# Patient Record
Sex: Male | Born: 1955 | Race: Black or African American | Hispanic: No | Marital: Single | Smoking: Never smoker
Health system: Southern US, Community
[De-identification: ages and names within clinical notes are randomized; demographics above are authoritative.]

## PROBLEM LIST (undated history)

## (undated) DIAGNOSIS — M109 Gout, unspecified: Secondary | ICD-10-CM

## (undated) DIAGNOSIS — I10 Essential (primary) hypertension: Secondary | ICD-10-CM

## (undated) DIAGNOSIS — I8290 Acute embolism and thrombosis of unspecified vein: Secondary | ICD-10-CM

## (undated) HISTORY — DX: Acute embolism and thrombosis of unspecified vein: I82.90

## (undated) HISTORY — DX: Gout, unspecified: M10.9

## (undated) HISTORY — DX: Essential (primary) hypertension: I10

## (undated) HISTORY — PX: FRACTURE SURGERY: SHX138

---

## 2010-04-19 ENCOUNTER — Inpatient Hospital Stay: Payer: Self-pay | Admitting: Internal Medicine

## 2010-04-25 ENCOUNTER — Ambulatory Visit: Payer: Self-pay | Admitting: Internal Medicine

## 2010-05-02 ENCOUNTER — Ambulatory Visit: Payer: Self-pay | Admitting: Internal Medicine

## 2010-05-25 ENCOUNTER — Ambulatory Visit: Payer: Self-pay

## 2010-05-31 ENCOUNTER — Ambulatory Visit: Payer: Self-pay | Admitting: Internal Medicine

## 2011-12-05 ENCOUNTER — Emergency Department: Payer: Self-pay | Admitting: *Deleted

## 2011-12-05 LAB — COMPREHENSIVE METABOLIC PANEL
Alkaline Phosphatase: 87 U/L (ref 50–136)
Anion Gap: 5 — ABNORMAL LOW (ref 7–16)
Calcium, Total: 9.3 mg/dL (ref 8.5–10.1)
Chloride: 106 mmol/L (ref 98–107)
Co2: 28 mmol/L (ref 21–32)
Creatinine: 1.58 mg/dL — ABNORMAL HIGH (ref 0.60–1.30)
Sodium: 139 mmol/L (ref 136–145)

## 2011-12-05 LAB — CBC WITH DIFFERENTIAL/PLATELET
Basophil #: 0 10*3/uL (ref 0.0–0.1)
Basophil %: 0.7 %
Eosinophil #: 0.1 10*3/uL (ref 0.0–0.7)
Eosinophil %: 2.6 %
HCT: 42.2 % (ref 40.0–52.0)
HGB: 13.5 g/dL (ref 13.0–18.0)
Lymphocyte #: 1.7 10*3/uL (ref 1.0–3.6)
MCH: 26.2 pg (ref 26.0–34.0)
MCV: 82 fL (ref 80–100)
Monocyte #: 0.5 x10 3/mm (ref 0.2–1.0)
Neutrophil #: 1.6 10*3/uL (ref 1.4–6.5)
Platelet: 191 10*3/uL (ref 150–440)
RBC: 5.16 10*6/uL (ref 4.40–5.90)

## 2011-12-05 LAB — PROTIME-INR
INR: 3.4
Prothrombin Time: 34.2 secs — ABNORMAL HIGH (ref 11.5–14.7)

## 2011-12-05 LAB — URINALYSIS, COMPLETE
RBC,UR: 1327 /HPF (ref 0–5)
Squamous Epithelial: NONE SEEN

## 2011-12-11 ENCOUNTER — Emergency Department: Payer: Self-pay | Admitting: Emergency Medicine

## 2011-12-11 LAB — URINALYSIS, COMPLETE
Leukocyte Esterase: NEGATIVE
Nitrite: NEGATIVE
Protein: 100
RBC,UR: 285 /HPF (ref 0–5)
WBC UR: 7 /HPF (ref 0–5)

## 2011-12-11 LAB — PROTIME-INR
INR: 1.9
Prothrombin Time: 22.4 secs — ABNORMAL HIGH (ref 11.5–14.7)

## 2011-12-11 LAB — BASIC METABOLIC PANEL
Anion Gap: 9 (ref 7–16)
Calcium, Total: 8.9 mg/dL (ref 8.5–10.1)
EGFR (Non-African Amer.): 59 — ABNORMAL LOW
Osmolality: 286 (ref 275–301)
Potassium: 4.1 mmol/L (ref 3.5–5.1)

## 2011-12-11 LAB — CBC
HCT: 33.8 % — ABNORMAL LOW (ref 40.0–52.0)
MCH: 27.1 pg (ref 26.0–34.0)
MCHC: 33.4 g/dL (ref 32.0–36.0)
MCV: 81 fL (ref 80–100)
RDW: 16.5 % — ABNORMAL HIGH (ref 11.5–14.5)

## 2011-12-11 LAB — APTT: Activated PTT: 35.9 secs (ref 23.6–35.9)

## 2011-12-14 ENCOUNTER — Emergency Department: Payer: Self-pay | Admitting: Emergency Medicine

## 2011-12-14 LAB — URINALYSIS, COMPLETE
Bilirubin,UR: NEGATIVE
Hyaline Cast: 3
Ketone: NEGATIVE
Ph: 5 (ref 4.5–8.0)
Protein: NEGATIVE
RBC,UR: 2 /HPF (ref 0–5)
Specific Gravity: 1.014 (ref 1.003–1.030)
Squamous Epithelial: NONE SEEN
WBC UR: 1 /HPF (ref 0–5)

## 2011-12-14 LAB — BASIC METABOLIC PANEL
BUN: 11 mg/dL (ref 7–18)
Calcium, Total: 8.8 mg/dL (ref 8.5–10.1)
Chloride: 108 mmol/L — ABNORMAL HIGH (ref 98–107)
Co2: 26 mmol/L (ref 21–32)
EGFR (African American): 56 — ABNORMAL LOW
Glucose: 129 mg/dL — ABNORMAL HIGH (ref 65–99)
Osmolality: 280 (ref 275–301)
Potassium: 4 mmol/L (ref 3.5–5.1)
Sodium: 140 mmol/L (ref 136–145)

## 2011-12-14 LAB — CBC
HGB: 11.4 g/dL — ABNORMAL LOW (ref 13.0–18.0)
MCH: 27.1 pg (ref 26.0–34.0)
MCHC: 33.4 g/dL (ref 32.0–36.0)
MCV: 81 fL (ref 80–100)
Platelet: 175 10*3/uL (ref 150–440)
RDW: 16.8 % — ABNORMAL HIGH (ref 11.5–14.5)

## 2011-12-14 LAB — PROTIME-INR: INR: 1.3

## 2012-04-27 ENCOUNTER — Emergency Department: Payer: Self-pay | Admitting: Unknown Physician Specialty

## 2012-04-27 LAB — COMPREHENSIVE METABOLIC PANEL
Anion Gap: 9 (ref 7–16)
BUN: 13 mg/dL (ref 7–18)
Bilirubin,Total: 0.2 mg/dL (ref 0.2–1.0)
Calcium, Total: 8.4 mg/dL — ABNORMAL LOW (ref 8.5–10.1)
Chloride: 107 mmol/L (ref 98–107)
Creatinine: 1.3 mg/dL (ref 0.60–1.30)
EGFR (Non-African Amer.): >60
Glucose: 99 mg/dL (ref 65–99)
Osmolality: 278 (ref 275–301)
Potassium: 3.9 mmol/L (ref 3.5–5.1)
SGPT (ALT): 21 U/L (ref 12–78)
Total Protein: 7.3 g/dL (ref 6.4–8.2)

## 2012-04-27 LAB — CBC: Platelet: 184 10*3/uL (ref 150–440)

## 2012-04-27 LAB — PROTIME-INR
INR: 1.7
Prothrombin Time: 20 secs — ABNORMAL HIGH (ref 11.5–14.7)

## 2012-04-27 LAB — CK TOTAL AND CKMB (NOT AT ARMC): CK-MB: <0.5 ng/mL — ABNORMAL LOW (ref 0.5–3.6)

## 2012-09-18 ENCOUNTER — Emergency Department: Payer: Self-pay | Admitting: Emergency Medicine

## 2012-09-30 DIAGNOSIS — I10 Essential (primary) hypertension: Secondary | ICD-10-CM | POA: Insufficient documentation

## 2013-05-23 ENCOUNTER — Inpatient Hospital Stay: Payer: Self-pay | Admitting: Internal Medicine

## 2013-05-23 LAB — CBC
HCT: 40.4 % (ref 40.0–52.0)
HGB: 12.8 g/dL — ABNORMAL LOW (ref 13.0–18.0)
MCH: 25.4 pg — AB (ref 26.0–34.0)
MCHC: 31.6 g/dL — AB (ref 32.0–36.0)
MCV: 80 fL (ref 80–100)
Platelet: 179 10*3/uL (ref 150–440)
RBC: 5.03 10*6/uL (ref 4.40–5.90)
RDW: 17.9 % — ABNORMAL HIGH (ref 11.5–14.5)
WBC: 3.3 10*3/uL — AB (ref 3.8–10.6)

## 2013-05-23 LAB — COMPREHENSIVE METABOLIC PANEL
ALBUMIN: 3.8 g/dL (ref 3.4–5.0)
AST: 24 U/L (ref 15–37)
Alkaline Phosphatase: 76 U/L
Anion Gap: 5 — ABNORMAL LOW (ref 7–16)
BILIRUBIN TOTAL: 0.4 mg/dL (ref 0.2–1.0)
BUN: 10 mg/dL (ref 7–18)
Calcium, Total: 9 mg/dL (ref 8.5–10.1)
Chloride: 111 mmol/L — ABNORMAL HIGH (ref 98–107)
Co2: 25 mmol/L (ref 21–32)
Creatinine: 1.67 mg/dL — ABNORMAL HIGH (ref 0.60–1.30)
EGFR (African American): 52 — ABNORMAL LOW
EGFR (Non-African Amer.): 45 — ABNORMAL LOW
Glucose: 118 mg/dL — ABNORMAL HIGH (ref 65–99)
Osmolality: 281 (ref 275–301)
Potassium: 3.8 mmol/L (ref 3.5–5.1)
SGPT (ALT): 20 U/L (ref 12–78)
Sodium: 141 mmol/L (ref 136–145)
Total Protein: 7.8 g/dL (ref 6.4–8.2)

## 2013-05-23 LAB — PROTIME-INR
INR: 1.1
Prothrombin Time: 13.7 secs (ref 11.5–14.7)

## 2013-05-23 LAB — TROPONIN I: Troponin-I: <0.02 ng/mL

## 2013-05-24 DIAGNOSIS — I6350 Cerebral infarction due to unspecified occlusion or stenosis of unspecified cerebral artery: Secondary | ICD-10-CM

## 2013-05-24 LAB — BASIC METABOLIC PANEL
ANION GAP: 6 — AB (ref 7–16)
BUN: 13 mg/dL (ref 7–18)
CHLORIDE: 109 mmol/L — AB (ref 98–107)
CO2: 25 mmol/L (ref 21–32)
Calcium, Total: 8.6 mg/dL (ref 8.5–10.1)
Creatinine: 1.32 mg/dL — ABNORMAL HIGH (ref 0.60–1.30)
EGFR (African American): >60
EGFR (Non-African Amer.): 59 — ABNORMAL LOW
GLUCOSE: 109 mg/dL — AB (ref 65–99)
Osmolality: 280 (ref 275–301)
Potassium: 3.5 mmol/L (ref 3.5–5.1)
Sodium: 140 mmol/L (ref 136–145)

## 2013-05-24 LAB — LIPID PANEL
Cholesterol: 167 mg/dL (ref 0–200)
HDL Cholesterol: 27 mg/dL — ABNORMAL LOW (ref 40–60)
Ldl Cholesterol, Calc: 77 mg/dL (ref 0–100)
Triglycerides: 313 mg/dL — ABNORMAL HIGH (ref 0–200)
VLDL CHOLESTEROL, CALC: 63 mg/dL — AB (ref 5–40)

## 2013-05-24 LAB — CK-MB: CK-MB: <0.5 ng/mL — ABNORMAL LOW (ref 0.5–3.6)

## 2013-05-24 LAB — TROPONIN I
Troponin-I: <0.02 ng/mL
Troponin-I: <0.02 ng/mL
Troponin-I: <0.02 ng/mL

## 2013-05-24 LAB — TSH: THYROID STIMULATING HORM: 3.85 u[IU]/mL

## 2013-05-25 LAB — PROTIME-INR
INR: 1.1
Prothrombin Time: 14.5 secs (ref 11.5–14.7)

## 2013-10-05 ENCOUNTER — Ambulatory Visit: Payer: Self-pay | Admitting: Internal Medicine

## 2014-05-03 ENCOUNTER — Ambulatory Visit: Payer: Self-pay | Admitting: Specialist

## 2014-07-23 NOTE — Discharge Summary (Signed)
PATIENT NAME:  Clifford Farley, Clifford Farley MR#:  [id number] DATE OF BIRTH:  Jun 13, 1955  DATE OF ADMISSION:  05/23/2013 DATE OF DISCHARGE:  05/25/2013  DISCHARGE DIAGNOSES: 1. Acute cerebrovascular accident.  2. Hyperlipidemia.  3. History of deep vein thrombosis and pulmonary embolus subtherapeutic INR switched to newer agents.   CODE STATUS ON DISCHARGE: FULL CODE.  MEDICATIONS ON DISCHARGE:   2. Xarelto 20 mg oral once a day.  3. Atorvastatin 20 mg oral once a day.  Diet- Low-sodium, low-fat and low-cholesterol.   DIET ON DISCHARGE. Regular consistency.   ACTIVITY: As tolerated.   TIMEFRAME TO FOLLOW-UP: Within 1 to 2 week with internal medicine doctors.   HISTORY OF PRESENT ILLNESS: This is a 59 year old pleasant African American male with past medical history of hypertension, DVT, PE, presented to Emergency Department with complaint of left-sided weakness, started from previous night. He went to take shower, came back started experiencing weakness on entire left side of the body. Initially felt it would resolve in the morning, but woke up in the morning next day and had  same weakness, so came to the  Emergency Room. CT scan of the head did not show anything. The patient continued to have weakness and as per the fiancee the patient also had wobbly walking, admitted for further management.   HOSPITAL COURSE AND STAY:  1. Acute cerebrovascular accident, acute stroke found on MRI. The patient was taking Coumadin for DVT and PE, but INR was subtherapeutic, so neurologic consult was called in. They suggested to start on Xarelto. I explained to the patient about compliance with his medicines and he understood and agreed, so we discharged with that. He has some persistent weakness, and so physical therapy consult was suggested in hospital, also started on statin for stroke.  2. Hypertension. We started on lisinopril and discharged on the same.  3. History of deep vein thrombosis and pulmonary embolus  as mentioned above with acute stroke and subtherapeutic INR. We switch it to new agent, Xarelto. 4. Hyperlipidemia. Continue atorvastatin on discharge.   CONSULT IN THE HOSPITAL: Dr. Cristopher PeruHemang Shah for neurology.   IMPORTANT LABORATORY RESULTS: INR was 1.1 on admission. WBC 3.3, hemoglobin 12.8, platelet count 179, BUN 1.67,  BUN 10, creatinine 1.67 on admission. Troponin remained negative, less than 0.02. CT scan negative. Carotid Doppler study minimal amount of bilateral internal wall thickening, atherosclerotic plaque, but not resulting in hemodynamically significant stenosis. Creatinine came down to 1.32, glucose level was 167, triglycerides 313, HDL was 27. Echocardiogram left ventricular ejection fraction 60% to 65%, normal function, normal right ventricular size and function, technically difficult study.   TOTAL TIME SPENT ON THIS DISCHARGE: 40 minutes.   ____________________________ Hope PigeonVaibhavkumar G. Elisabeth PigeonVachhani, MD vgv:sg D: 05/29/2013 22:56:34 ET T: 05/30/2013 12:52:57 ET JOB#: [id number]  cc: Hope PigeonVaibhavkumar G. Elisabeth PigeonVachhani, MD, <Dictator> Hemang K. Sherryll BurgerShah, MD  Altamese DillingVAIBHAVKUMAR Ousman Dise MD ELECTRONICALLY SIGNED 06/07/2013 7:48

## 2014-07-23 NOTE — Consult Note (Signed)
Referring Physician:  Monica Becton :   Reason for Consult: Admit Date: 23-May-2013  Chief Complaint: left sided weakness   History of Present Illness: History of Present Illness:   [name] is a 59 year old, pleasant, African American male with a past medical history of hypertension, DVT, PE. Presented to the Emergency Department with complaints of left-sided weakness. Started since saturday night 11 pm. The patient went to take a shower, came back, started to experience weakness on the entire left side of the body. The patient initially felt this would resolve by morning. Woke up, continued to have the weakness. Concerning this, came to the Emergency Department. Workup in the Emergency Department with a CT head did not show any acute stroke. The patient continued to have the weakness. Per the patient's fiancee, the patient has been wobbly. Denied having any change in speech. Had some blurred vision in the right eye. Did not have any chest pain.   PAST MEDICAL HISTORY: Hypertension. DVT. PE.  SURGICAL HISTORY: None.  No known drug allergies.  MEDICATIONS: Coumadin 4 mg once a day.  HISTORY: No history of smoking, drinking alcohol or using illicit drugs. 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  HISTORY: Stroke in the father.   ROS:  General denies complaints   HEENT no complaints   Lungs no complaints   Cardiac no complaints   GI no complaints   GU no complaints   Musculoskeletal no complaints   Extremities no complaints   Skin no complaints   Neuro numbness/tingling  weakness   Endocrine no complaints   Psych no complaints   Home Medications: Medication Instructions Last Modified Date/Time  warfarin 4 mg oral tablet 1 tab(s) orally once a day (at bedtime) 22-Feb-15 19:54   Vital Signs: **Vital Signs.:   23-Feb-15 19:47  Vital Signs Type Routine  Temperature Temperature (F) 98  Celsius 36.6  Temperature Source oral  Pulse Pulse 81   Respirations Respirations 18  Systolic BP Systolic BP 024  Diastolic BP (mmHg) Diastolic BP (mmHg) 81  Mean BP 99  Pulse Ox % Pulse Ox % 95  Pulse Ox Activity Level  At rest  Oxygen Delivery Room Air/ 21 %   EXAM: General Exam Patient looks appropriate of age, well built, nourished and appropriately groomed.   Cardiovascular Exam: S1, S2 heart sounds present Carotid exam revealed no bruit Lung exam was clear to auscultation belly soft  Neurological Exam      Mental Status:      Alert,     Oriented to time, place, person and situation     Attention span and concentration seemed appropriate     Memory seemed OK.     Intact naming, repetition, comprehension.       Followed 2 step commands - no dysarthria     Fund of knowledge seemed appropriate for age and health status. no neurological neglect       Cranial Nerves:      Olfactory and vagus nerves not are examined      Visual fields were full      Pupils were equal, round and reactive to light and accommodation      Extra-ocular movements are normal      Facial sensations are normal      Face is symmetric      Finger rub was heard symmetric in both ears      Palate and uvular movements are normal and oral sensations are OK  Neck muscle strength and shoulder shrug is normal      Tongue protrusion and uvular elevation are normal       Motor Exam:      Tone is normal in all extremities left UE strength 4-/5 and LE 4+/5      No abnormal movements, fasciculations or atrophy seen       Deep Tendon Reflexes:      symmetric 2 +      Right Toes are down going,  Left Toes are down going            Sensory Exam:      Sensations were  impaired to LT in left hemibody (extremities, torso )             Co-ordination:      Finger to nose is abnormal on left            Gait:      Gait impaired.  Lab Results: Thyroid:  23-Feb-15 05:11   Thyroid Stimulating Hormone 3.85 (0.45-4.50 (International Unit)   ----------------------- Pregnant patients have  different reference  ranges for TSH:  - - - - - - - - - -  Pregnant, first trimetser:  0.36 - 2.50 uIU/mL)  LabObservation:  23-Feb-15 18:41   OBSERVATION Reason for Test  Hepatic:  22-Feb-15 19:11   Bilirubin, Total 0.4  Alkaline Phosphatase 76 (45-117 NOTE: New Reference Range 02/19/13)  SGPT (ALT) 20  SGOT (AST) 24  Total Protein, Serum 7.8  Albumin, Serum 3.8  Routine Chem:  23-Feb-15 05:11   Glucose, Serum  109  BUN 13  Creatinine (comp)  1.32  Sodium, Serum 140  Potassium, Serum 3.5  Chloride, Serum  109  CO2, Serum 25  Calcium (Total), Serum 8.6  Anion Gap  6  Osmolality (calc) 280  eGFR (African American) >60  eGFR (Non-African American)  59 (eGFR values <57m/min/1.73 m2 may be an indication of chronic kidney disease (CKD). Calculated eGFR is useful in patients with stable renal function. The eGFR calculation will not be reliable in acutely ill patients when serum creatinine is changing rapidly. It is not useful in  patients on dialysis. The eGFR calculation may not be applicable to patients at the low and high extremes of body sizes, pregnant women, and vegetarians.)  Cholesterol, Serum 167  Triglycerides, Serum  313  HDL (INHOUSE)  27  VLDL Cholesterol Calculated  63  LDL Cholesterol Calculated 77 (Result(s) reported on 24 May 2013 at 05:51AM.)  Cardiac:  23-Feb-15 10:21   CPK-MB, Serum  < 0.5 (Result(s) reported on 24 May 2013 at 11:04AM.)  Troponin I < 0.02 (0.00-0.05 0.05 ng/mL or less: NEGATIVE  Repeat testing in 3-6 hrs  if clinically indicated. >0.05 ng/mL: POTENTIAL  MYOCARDIAL INJURY. Repeat  testing in 3-6 hrs if  clinically indicated. NOTE: An increase or decrease  of 30% or more on serial  testing suggests a  clinically important change)  Routine Coag:  22-Feb-15 19:10   Prothrombin 13.7  INR 1.1 (INR reference interval applies to patients on anticoagulant therapy. A single INR  therapeutic range for coumarins is not optimal for all indications; however, the suggested range for most indications is 2.0 - 3.0. Exceptions to the INR Reference Range may include: Prosthetic heart valves, acute myocardial infarction, prevention of myocardial infarction, and combinations of aspirin and anticoagulant. The need for a higher or lower target INR must be assessed individually. Reference: The Pharmacology and Management of the Vitamin K  antagonists:  the seventh ACCP Conference on Antithrombotic and Thrombolytic Therapy. [id number].6644 Sept:126 (3suppl): [id number]. A HCT value >55% may artifactually increase the PT.  In one study,  the increase was an average of 25%. Reference:  "Effect on Routine and Special Coagulation Testing Values of Citrate Anticoagulant Adjustment in Patients with High HCT Values." American Journal of Clinical Pathology 2006;126:400-405.)  Routine Hem:  22-Feb-15 19:11   WBC (CBC)  3.3  RBC (CBC) 5.03  Hemoglobin (CBC)  12.8  Hematocrit (CBC) 40.4  Platelet Count (CBC) 179 (Result(s) reported on 23 May 2013 at 07:45PM.)  MCV 80  MCH  25.4  MCHC  31.6  RDW  17.9   Radiology Results: [place]:    23-Feb-15 04:00, US Carotid Doppler Bilateral  US Carotid Doppler Bilateral   REASON FOR EXAM:    cva  COMMENTS:       PROCEDURE: [place]  - US CAROTID DOPPLER BILATERAL  - May 24 2013  4:00AM     CLINICAL DATA:  CVA, left-sided numbness, history of hypertension,  visual disturbance    EXAM:  BILATERAL CAROTID DUPLEX ULTRASOUND    TECHNIQUE:  Pearline Cables scale imaging, color Doppler and duplex ultrasound were  performed of bilateral carotid and vertebral arteries in the neck.  COMPARISON:  None.    FINDINGS:  Criteria: Quantification of carotid stenosis is based on velocity  parameters that correlate the residual internal carotid diameter  with NASCET-based stenosis levels, using the diameter of the distal  internal carotid lumen as the denominator for stenosis  measurement.    The following velocity measurements were obtained:    RIGHT    ICA:  68/19 cm/sec    CCA:  034/74 cm/sec  SYSTOLIC ICA/CCA RATIO:  0.6    DIASTOLIC ICA/CCA RATIO:  0.6    ECA:  1 L4 cm/sec    LEFT    ICA:  73/32 cm/sec    CCA:  259/56 cm/sec    SYSTOLIC ICA/CCA RATIO:  0.6    DIASTOLIC ICA/CCA RATIO:  1.1  ECA:  43 cm/sec    RIGHT CAROTID ARTERY: There is a minimal amount of intimal  thickening within the left carotid bulb (image 16) not result in  elevated peak systolic velocities within the right internal carotid  artery to suggest a hemodynamically significant stenosis.    RIGHT VERTEBRAL ARTERY:  Antegrade flow    LEFT CAROTID ARTERY: There is a minimal amount eccentric intimal  wall thickening and echogenic plaque within the left carotid bulb  (image 47), extending to involve the origin of the left internal  carotid artery (image 54), not resulting in elevated peak systolic  velocities within the interrogated course of the left internal  carotid artery to suggest a hemodynamically significant stenosis.  LEFT VERTEBRAL ARTERY:  Antegrade flow     IMPRESSION:  Minimal amount of bilateral intimal wall thickening and  atherosclerotic plaque, left subjectively greater than right, not  resulting in hemodynamically significant stenosis.      Electronically Signed    By: Sandi Mariscal M.D.    On: 05/24/2013 07:51         Verified By: Aileen Fass, M.D.,  MRI:    23-Feb-15 10:08, MRI Brain Without Contrast  MRI Brain Without Contrast   REASON FOR EXAM:    cva  COMMENTS:       PROCEDURE: MR  - MR BRAIN WO CONTRAST  - May 24 2013 10:08AM     CLINICAL DATA:  Left-sided weakness and tingling  EXAM:  MRI HEAD WITHOUT CONTRAST    TECHNIQUE:  Multiplanar, multiecho pulse sequences of the brain and surrounding  structures were obtained without intravenous contrast.    COMPARISON:  Head CT 05/23/2013  FINDINGS:  Diffusion imaging shows a 1  cm acute infarction within the right  thalamus. No other acute infarction. The brainstem and cerebellum  are normal. The cerebral hemispheres show a few old small vessel  insults within the white matter. No cortical or large vessel  territory infarction. No intra-axial mass lesion, hemorrhage,  hydrocephalus or extra-axial collection. 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 Dilated perivascular spaces are present at the base of the  brain. No pituitary mass. No inflammatory sinus disease. No skull or  skullbase lesion.     IMPRESSION:  1 cm acute infarction in the right thalamus without hemorrhage or  mass effect.  Mild chronic small-vessel changes elsewhere in the cerebral  hemispheric white matter.      Electronically Signed    By: Nelson Chimes M.D.    On: 05/24/2013 10:14         Verified By: Jules Schick, M.D.,   Radiology Impression: Radiology Impression: MRI brain - right thalamus cytotoxic edema consistant with ischemic infract, some left frontal subcortical WM change and right virchow-robin space.   Impression/Recommendations: Recommendations:   Stroke:and symptoms: left hemibody weakness (UE>LE), left UE ataxia and left hemibody numbess, ill defined vision symptoms that resolvedof onset: 05/22/2013 11 pm, was not candidate for tPA due to late arrival to ER.3 (left arm 1, left arm ataxia 1, left hemibody sensory 1): rigth thalamusterritory: right PCA - thalamo perforator branchesantiplatelet/anticoagulant agent - warfarin (poor compliance ? acess issue - INR 1.1with Work up: ?vessel ultrasound (carotid and vertebrals): intimal thickening but no sig stenosismonitoring: to look for arrythmia as potential cause of infractlipid profile: hypertriglyceridemia and low HDLordered switch to xarelto 20 mg /day  avoid extreme  hypo/hyper tension and gentle BP reducation in peristroke period to maintain perfusion in ischemic penumbra. (160/90 with MAP 110. Consider using short acting meds  with short half life, avoid NTG and nitropruside due to chance of increasing intracranial pressure)treat fever early (hyperthermia impairs stroke recovery), aggressive PT/OT/ST, early bedside swallow evaluation. (dual antiplatelet therapy is not more beneficial than single agent for secondary stroke prevention perspective, and may be harmful), Consider statin  (Lipid lowering and non lipid lowering effects of statin are useful in secondary stroke prevention.pt should be considered for evaluation of OSA which is a treatable risk factor.Post discharge long term cardiac rhythm monitoring has shown much higher incidence of paroxysmal A.fib in patient's with cryptogenic ischemic infract.DVT prophylaxis Consider flu vaccine and multivitamines.  will follow.  Electronic Signatures: Ray Church (MD)  (Signed [phone] 22:39)  Authored: REFERRING PHYSICIAN, Consult, History of Present Illness, Review of Systems, HOME MEDICATIONS, NURSING VITAL SIGNS, Physical Exam-, LAB RESULTS, RADIOLOGY RESULTS, Recommendations   Last Updated: 23-Feb-15 22:39 by Ray Church (MD)

## 2014-07-23 NOTE — H&P (Signed)
PATIENT NAME:  Clifford Farley, [name]:  [id number] DATE OF BIRTH:  08-Apr-1955  DATE OF ADMISSION:  05/23/2013  PRIMARY CARE PHYSICIAN: Nonlocal.    REFERRING PHYSICIAN: Dr. Governor Rooksebecca Lord.   CHIEF COMPLAINT: Left-sided weakness.   HISTORY OF PRESENT ILLNESS: Clifford Farley is a 59 year old, pleasant, African American male with a past medical history of hypertension, DVT, PE. Presented to the Emergency Department with complaints of left-sided weakness. Started since last night. The patient went to take a shower, came back, started to experience weakness on the entire left side of the body. The patient initially felt this would resolve by morning. Woke up, continued to have the weakness. Concerning this, came to the Emergency Department. Workup in the Emergency Department with a CT head did not show any acute stroke. The patient continued to have the weakness. Per the patient's fiancee, the patient has been wobbly. Denied having any change in speech. Had some blurred vision in the right eye. Did not have any chest pain.   PAST MEDICAL HISTORY:  1. Hypertension.  2. DVT.  3. PE.   PAST SURGICAL HISTORY: None.   ALLERGIES: No known drug allergies.   HOME MEDICATIONS: Coumadin 4 mg once a day.   SOCIAL HISTORY: No history of smoking, drinking alcohol or using illicit drugs. 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   FAMILY HISTORY: Stroke in the father.   REVIEW OF SYSTEMS:  CONSTITUTIONAL: Denies any generalized.  EYES: No .  ENT: No change in hearing.   RESPIRATORY: No cough, shortness of breath.  CARDIOVASCULAR: No chest pain, palpitations.  GASTROINTESTINAL: No nausea, vomiting, abdominal pain.  GENITOURINARY: No dysuria or hematuria.  ENDOCRINE: No polyuria or polydipsia.  HEMATOLOGIC: No easy bruising or bleeding.  SKIN: No rash or lesions.  MUSCULOSKELETAL: No joint pains and aches.  NEUROLOGIC: Has weakness on the left side of the body.   PHYSICAL EXAMINATION:   GENERAL: This is a well-built, well-nourished, age-appropriate male lying down in the bed, not in distress.  VITAL SIGNS: Temperature 98.4, pulse 82, blood pressure 164/102, respiratory rate of 16, oxygen saturation 100% on room air.  HEENT: Head normocephalic, atraumatic. There is no scleral icterus. Conjunctivae normal. Pupils equal and reactive. Extraocular movements are intact. Mucous membranes moist. No pharyngeal erythema.  NECK: Supple. No lymphadenopathy. No JVD. No carotid bruit. No thyromegaly.  CHEST: Has no focal tenderness.  LUNGS: Bilaterally clear to auscultation.  HEART: S1 and S2 regular. No murmurs are heard.  ABDOMEN: Bowel sounds present. Soft, nontender, nondistended.  EXTREMITIES: No pedal edema. Pulses 2+.  SKIN: No rash or lesions.  MUSCULOSKELETAL: Good range of motion in all of the extremities.  NEUROLOGIC: The patient is alert, oriented to place, person and time. Cranial nerves II through XII intact. Motor: Has left pronator drift. Motor 4/5 in the left upper extremity. Left lower extremity slightly weaker than the right; however, the patient was able to ambulate. Babinski equivocal on the left side.   LABORATORIES: Troponin less than 0.02.   CT head without contrast: No acute intracranial abnormality.   CMP: BUN 10, creatinine of 1.67. The rest of all of the values are within normal limits.   CBC: WBC of 3.3, hemoglobin 12.8, platelet count of 179.   PT/INR 13 and 1.1.   ASSESSMENT AND PLAN: Clifford Farley is a 59 year old male with known history of deep vein thrombosis and pulmonary embolism in the past, on chronic anticoagulation. Comes to the Emergency Department for left-sided weakness.  1.  Cerebrovascular accident: Concerning the patient's age, there is a high possibility of atherosclerotic disease. Unknown cause of the patient's deep vein thrombosis and pulmonary embolism. Could have caused the  thrombosis; however, admit the patient to a monitored bed. Check  echocardiogram, carotid Dopplers and MRA of the brain in the morning. Keep the patient on aspirin 81 mg daily. Continue with Coumadin.  2. Hypertension: Start the patient on lisinopril.  3. The patient is already on Coumadin which should provide deep vein thrombosis prophylaxis.   ____________________________ Susa Griffins, MD pv:gb D: 05/23/2013 22:52:51 ET T: 05/24/2013 01:56:13 ET JOB#: [id number]  cc: Susa Griffins, MD, <Dictator> Susa Griffins MD ELECTRONICALLY SIGNED 06/03/2013 1:31

## 2014-08-02 ENCOUNTER — Other Ambulatory Visit: Payer: Self-pay

## 2014-08-17 ENCOUNTER — Ambulatory Visit: Payer: Self-pay | Admitting: Internal Medicine

## 2014-08-24 ENCOUNTER — Ambulatory Visit: Payer: Self-pay

## 2014-09-07 ENCOUNTER — Ambulatory Visit: Payer: Self-pay

## 2014-09-21 ENCOUNTER — Ambulatory Visit: Payer: Self-pay

## 2014-10-12 ENCOUNTER — Ambulatory Visit: Payer: Self-pay

## 2014-11-02 ENCOUNTER — Ambulatory Visit: Payer: Self-pay | Admitting: Internal Medicine

## 2014-11-30 ENCOUNTER — Other Ambulatory Visit: Payer: Self-pay

## 2014-11-30 LAB — BASIC METABOLIC PANEL
BUN: 11 mg/dL (ref 4–21)
CREATININE: 1.2 mg/dL (ref 0.6–1.3)
Glucose: 95 mg/dL
Potassium: 4 mmol/L (ref 3.4–5.3)
Sodium: 140 mmol/L (ref 137–147)

## 2014-11-30 LAB — LIPID PANEL
Cholesterol: 170 mg/dL (ref 0–200)
HDL: 32 mg/dL — AB (ref 35–70)
LDL Cholesterol: 113 mg/dL
Triglycerides: 126 mg/dL (ref 40–160)

## 2014-11-30 LAB — CBC AND DIFFERENTIAL
HCT: 40 % — AB (ref 41–53)
Hemoglobin: 13.2 g/dL — AB (ref 13.5–17.5)
Neutrophils Absolute: 1 /uL
Platelets: 218 10*3/uL (ref 150–399)
WBC: 3.1 10*3/mL

## 2014-11-30 LAB — HEPATIC FUNCTION PANEL
ALT: 6 U/L — AB (ref 10–40)
AST: 11 U/L — AB (ref 14–40)
Alkaline Phosphatase: 60 U/L (ref 25–125)
BILIRUBIN, TOTAL: 0.4 mg/dL

## 2014-11-30 LAB — TSH: TSH: 4.88 u[IU]/mL (ref 0.41–5.90)

## 2014-11-30 LAB — HEMOGLOBIN A1C: HEMOGLOBIN A1C: 6.3

## 2014-12-07 ENCOUNTER — Ambulatory Visit: Payer: Self-pay | Admitting: Internal Medicine

## 2014-12-07 DIAGNOSIS — I2699 Other pulmonary embolism without acute cor pulmonale: Secondary | ICD-10-CM | POA: Insufficient documentation

## 2015-01-04 ENCOUNTER — Ambulatory Visit: Payer: Self-pay

## 2015-01-18 ENCOUNTER — Ambulatory Visit: Payer: Self-pay

## 2015-01-25 ENCOUNTER — Ambulatory Visit: Payer: Self-pay

## 2015-02-08 ENCOUNTER — Ambulatory Visit: Payer: Self-pay

## 2015-03-08 ENCOUNTER — Ambulatory Visit: Payer: Self-pay | Admitting: Internal Medicine

## 2015-03-08 DIAGNOSIS — Z0189 Encounter for other specified special examinations: Secondary | ICD-10-CM | POA: Insufficient documentation

## 2015-03-22 ENCOUNTER — Ambulatory Visit: Payer: Self-pay

## 2015-03-29 ENCOUNTER — Ambulatory Visit: Payer: Self-pay

## 2015-04-19 ENCOUNTER — Ambulatory Visit: Payer: Self-pay

## 2015-05-17 ENCOUNTER — Ambulatory Visit: Payer: Self-pay | Admitting: Internal Medicine

## 2015-05-24 ENCOUNTER — Ambulatory Visit: Payer: Self-pay

## 2015-05-24 LAB — POCT INR: INR: 1.5 — AB (ref <=1.1)

## 2015-05-24 LAB — PROTIME-INR: Protime: 17.7 seconds — AB (ref 10.0–13.8)

## 2015-05-31 DIAGNOSIS — I2782 Chronic pulmonary embolism: Secondary | ICD-10-CM

## 2015-05-31 DIAGNOSIS — I10 Essential (primary) hypertension: Secondary | ICD-10-CM

## 2015-05-31 DIAGNOSIS — Z0189 Encounter for other specified special examinations: Secondary | ICD-10-CM

## 2015-06-07 ENCOUNTER — Ambulatory Visit: Payer: Self-pay

## 2015-06-07 ENCOUNTER — Telehealth: Payer: Self-pay | Admitting: Internal Medicine

## 2015-06-07 DIAGNOSIS — Z7901 Long term (current) use of anticoagulants: Secondary | ICD-10-CM

## 2015-06-07 LAB — PROTIME-INR: INR: 2.3 — AB (ref 0.9–1.1)

## 2015-06-14 ENCOUNTER — Ambulatory Visit: Payer: Self-pay

## 2015-07-05 ENCOUNTER — Ambulatory Visit: Payer: Self-pay

## 2015-07-06 NOTE — Telephone Encounter (Signed)
Mr resched missed appt on wed Apr 5 to wed April 12 @ 10:15

## 2015-07-12 ENCOUNTER — Ambulatory Visit: Payer: Self-pay

## 2015-07-12 DIAGNOSIS — Z0189 Encounter for other specified special examinations: Secondary | ICD-10-CM

## 2015-07-12 LAB — POCT INR: INR: 2.1

## 2015-07-12 NOTE — Addendum Note (Signed)
Addended by: Danise EdgeGROTSCHEL, Zaniya Mcaulay G on: 07/12/2015 01:52 PM   Modules accepted: Level of Service

## 2015-08-02 ENCOUNTER — Ambulatory Visit: Payer: Self-pay | Admitting: Internal Medicine

## 2015-08-02 DIAGNOSIS — T790XXD Air embolism (traumatic), subsequent encounter: Secondary | ICD-10-CM

## 2015-08-02 LAB — POCT INR: INR: 2.4

## 2015-08-02 NOTE — Progress Notes (Signed)
   Subjective:    Patient ID: Clifford Farley, male    DOB: [birth year], 60 y.o.   MRN: [id number]  HPI    Review of Systems     Objective:   Physical Exam        Assessment & Plan:

## 2015-08-02 NOTE — Progress Notes (Signed)
Patient ID: Clifford Farley, male   DOB: 24-Apr-1955, 60 y.o.   MRN: [id number]  POCT PT/INR:  INR: 28.7 PT: 2.4  Dosage: 2 mg Tuesday and Thursday, 4 mg all other days.

## 2015-09-06 ENCOUNTER — Ambulatory Visit: Payer: Self-pay

## 2015-09-06 DIAGNOSIS — I82512 Chronic embolism and thrombosis of left femoral vein: Secondary | ICD-10-CM

## 2015-09-06 LAB — POCT INR: INR: 2.7

## 2015-10-11 ENCOUNTER — Ambulatory Visit: Payer: Self-pay | Admitting: Internal Medicine

## 2015-10-11 DIAGNOSIS — I2782 Chronic pulmonary embolism: Secondary | ICD-10-CM

## 2015-10-11 LAB — POCT INR: INR: 1.6

## 2015-10-18 ENCOUNTER — Encounter: Payer: Self-pay | Admitting: Specialist

## 2015-12-26 ENCOUNTER — Telehealth: Payer: Self-pay

## 2015-12-26 NOTE — Telephone Encounter (Signed)
Called pt back to schedule appt. No answer. Left msg.  

## 2016-01-03 ENCOUNTER — Other Ambulatory Visit: Payer: Self-pay

## 2016-01-03 DIAGNOSIS — Z0189 Encounter for other specified special examinations: Secondary | ICD-10-CM

## 2016-01-03 LAB — POCT INR: INR: 1.4

## 2016-01-10 ENCOUNTER — Ambulatory Visit: Payer: Self-pay | Admitting: Internal Medicine

## 2016-01-10 ENCOUNTER — Encounter: Payer: Self-pay | Admitting: Internal Medicine

## 2016-01-10 VITALS — BP 157/102 | HR 99 | Temp 98.4°F | Wt 238.0 lb

## 2016-01-10 DIAGNOSIS — R609 Edema, unspecified: Secondary | ICD-10-CM

## 2016-01-10 NOTE — Patient Instructions (Signed)
Labs today Referral to xray of left knee for pain and swelling F/u in 1 week

## 2016-01-10 NOTE — Progress Notes (Signed)
   Subjective:    Patient ID: Clifford Farley, male    DOB: September 20, 1955, 60 y.o.   MRN: [id number]  HPI   Pt presents w/ left knee pain from arthritis. Knee has been swollen since last week. Reports taking 1 ibuprofen per day.  Pt reports sore on back that drained blood.   Patient Active Problem List   Diagnosis Date Noted  . Protime monitoring 03/08/2015  . Pulmonary embolism ([place]) 12/07/2014  . Hypertension 09/30/2012     Medication List       Accurate as of 01/10/16 10:17 AM. Always use your most recent med list.          ibuprofen 800 MG tablet Commonly known as:  ADVIL,MOTRIN Take 800 mg by mouth as needed.   verapamil 180 MG CR tablet Commonly known as:  CALAN-SR Take 180 mg by mouth daily.   warfarin 4 MG tablet Commonly known as:  COUMADIN Take 4 mg by mouth daily. Per 05/24/15 [place] PT/INR check, instructed to take '4mg'$  every day EXCEPT for Tuesdays and Thursdays which should be 2 mg for those two days.        Review of Systems  Imbedded cyst on left back.  Palpated and examined left knee.  BP at 128/90    Objective:   Physical Exam  Constitutional: He is oriented to person, place, and time.  Cardiovascular: Normal rate, regular rhythm and normal heart sounds.   Pulmonary/Chest: Effort normal and breath sounds normal.  Neurological: He is alert and oriented to person, place, and time.     BP (!) 157/102   Pulse 99   Temp 98.4 F (36.9 C) (Oral)   Wt 238 lb (108 kg)       Assessment & Plan:   Labs today: Met C, CBC, Uric acid, TSH, PSA, Lipid, Ua, Protine Referral to xray of left knee for pain and swelling F/u in 1 week

## 2016-01-11 ENCOUNTER — Other Ambulatory Visit: Payer: Self-pay

## 2016-01-11 ENCOUNTER — Ambulatory Visit: Payer: Self-pay | Admitting: Pharmacy Technician

## 2016-01-11 ENCOUNTER — Encounter (INDEPENDENT_AMBULATORY_CARE_PROVIDER_SITE_OTHER): Payer: Self-pay

## 2016-01-11 DIAGNOSIS — Z79899 Other long term (current) drug therapy: Secondary | ICD-10-CM

## 2016-01-11 LAB — LIPID PANEL
CHOL/HDL RATIO: 4.6 ratio (ref 0.0–5.0)
Cholesterol, Total: 158 mg/dL (ref 100–199)
HDL: 34 mg/dL — AB (ref >39)
LDL Calculated: 106 mg/dL — ABNORMAL HIGH (ref 0–99)
TRIGLYCERIDES: 91 mg/dL (ref 0–149)
VLDL CHOLESTEROL CAL: 18 mg/dL (ref 5–40)

## 2016-01-11 LAB — COMPREHENSIVE METABOLIC PANEL
A/G RATIO: 1.6 (ref 1.2–2.2)
ALT: 17 IU/L (ref 0–44)
AST: 19 IU/L (ref 0–40)
Albumin: 4.4 g/dL (ref 3.6–4.8)
Alkaline Phosphatase: 69 IU/L (ref 39–117)
BUN/Creatinine Ratio: 8 — ABNORMAL LOW (ref 10–24)
BUN: 9 mg/dL (ref 8–27)
Bilirubin Total: 0.3 mg/dL (ref 0.0–1.2)
CALCIUM: 9 mg/dL (ref 8.6–10.2)
CO2: 23 mmol/L (ref 18–29)
CREATININE: 1.08 mg/dL (ref 0.76–1.27)
Chloride: 102 mmol/L (ref 96–106)
GFR, EST AFRICAN AMERICAN: 86 mL/min/{1.73_m2} (ref >59)
GFR, EST NON AFRICAN AMERICAN: 74 mL/min/{1.73_m2} (ref >59)
GLUCOSE: 111 mg/dL — AB (ref 65–99)
Globulin, Total: 2.8 g/dL (ref 1.5–4.5)
POTASSIUM: 4.2 mmol/L (ref 3.5–5.2)
Sodium: 142 mmol/L (ref 134–144)
TOTAL PROTEIN: 7.2 g/dL (ref 6.0–8.5)

## 2016-01-11 LAB — CBC
HEMATOCRIT: 37.9 % (ref 37.5–51.0)
HEMOGLOBIN: 12.8 g/dL (ref 12.6–17.7)
MCH: 25.9 pg — AB (ref 26.6–33.0)
MCHC: 33.8 g/dL (ref 31.5–35.7)
MCV: 77 fL — ABNORMAL LOW (ref 79–97)
Platelets: 241 10*3/uL (ref 150–379)
RBC: 4.95 x10E6/uL (ref 4.14–5.80)
RDW: 16.9 % — ABNORMAL HIGH (ref 12.3–15.4)
WBC: 2.9 10*3/uL — ABNORMAL LOW (ref 3.4–10.8)

## 2016-01-11 LAB — TSH: TSH: 4.71 u[IU]/mL — AB (ref 0.450–4.500)

## 2016-01-11 LAB — PSA: Prostate Specific Ag, Serum: 0.9 ng/mL (ref 0.0–4.0)

## 2016-01-11 LAB — URIC ACID: Uric Acid: 9 mg/dL — ABNORMAL HIGH (ref 3.7–8.6)

## 2016-01-11 LAB — SEDIMENTATION RATE: SED RATE: 50 mm/h — AB (ref 0–30)

## 2016-01-11 MED ORDER — VERAPAMIL HCL ER 180 MG PO TBCR: 180.0000 mg | EXTENDED_RELEASE_TABLET | Freq: Every day | ORAL | 0 refills | Status: DC

## 2016-01-11 MED ORDER — WARFARIN SODIUM 4 MG PO TABS: 4.0000 mg | ORAL_TABLET | Freq: Every day | ORAL | 0 refills | Status: DC

## 2016-01-11 NOTE — Telephone Encounter (Signed)
Called pt to discuss change of appt. Pt verbalized understanding. PT asked about medication since he has medicare starting today where can he get his meds filled until he finds another doctor. [facility]

## 2016-01-11 NOTE — Addendum Note (Signed)
Addended by: Milana NaSHORE, Rashia Mckesson E on: 01/11/2016 01:34 PM   Modules accepted: Orders

## 2016-01-11 NOTE — Progress Notes (Signed)
Met with patient.  During eligibility appointment patient indicated that he has Medicare Part A, B & D.  Explained to patient that [facility] cannot provide medication assistance since he has prescription drug coverage.  Patient stated that he does not like his current Medicare plan.  Patient to come-in to see Keri for Medicare consult.  Provided patient with information about Low Income Subsidy and other Civil engineer, contracting based on her particular needs.  Patient to reach out to [facility] and Dr. Brynda Greathouse about accepting him as a patient.     [facility]

## 2016-01-17 ENCOUNTER — Other Ambulatory Visit: Payer: Self-pay

## 2016-01-17 DIAGNOSIS — M545 Low back pain: Secondary | ICD-10-CM

## 2016-01-17 MED ORDER — IBUPROFEN 800 MG PO TABS: 800.0000 mg | ORAL_TABLET | ORAL | 0 refills | Status: DC | PRN

## 2016-01-17 NOTE — Telephone Encounter (Signed)
Refill sent to pharmacy.   

## 2016-01-24 ENCOUNTER — Ambulatory Visit: Payer: Self-pay

## 2016-01-24 ENCOUNTER — Telehealth: Payer: Self-pay

## 2016-01-24 ENCOUNTER — Other Ambulatory Visit: Payer: Self-pay

## 2016-01-24 ENCOUNTER — Ambulatory Visit: Payer: Self-pay | Admitting: Internal Medicine

## 2016-01-24 DIAGNOSIS — M545 Low back pain: Secondary | ICD-10-CM

## 2016-01-24 DIAGNOSIS — I2699 Other pulmonary embolism without acute cor pulmonale: Secondary | ICD-10-CM

## 2016-01-24 MED ORDER — VERAPAMIL HCL ER 180 MG PO TBCR: 180.0000 mg | EXTENDED_RELEASE_TABLET | Freq: Every day | ORAL | 0 refills | Status: DC

## 2016-01-24 MED ORDER — IBUPROFEN 800 MG PO TABS: 800.0000 mg | ORAL_TABLET | ORAL | 0 refills | Status: DC | PRN

## 2016-01-24 MED ORDER — WARFARIN SODIUM 4 MG PO TABS: 4.0000 mg | ORAL_TABLET | Freq: Every day | ORAL | 0 refills | Status: DC

## 2016-01-24 NOTE — Telephone Encounter (Signed)
Called pt to see what pharmacy he wantes RX sent to. Pt stated [facility]. Rx sent to pharmacy

## 2016-01-29 ENCOUNTER — Ambulatory Visit: Payer: Self-pay | Admitting: Pharmacist

## 2016-01-29 ENCOUNTER — Encounter: Payer: Self-pay | Admitting: Pharmacist

## 2016-01-29 DIAGNOSIS — Z79899 Other long term (current) drug therapy: Secondary | ICD-10-CM

## 2016-01-29 NOTE — Progress Notes (Signed)
[name] is a 60 year old male here today to review Medicare Part D and Medicare Advantage plans for 2018. [facility] and wanted to compare this plan with other plans for 2018.  He is taking three prescription medications and currently obtaining an INR level once per month. He will be scheduling an appointment with the orthopedic specialist once the referral is complete.  He is in the process of looking for a new primary care provider. [facility].   Current Outpatient Prescriptions  Medication Sig Dispense Refill  . ibuprofen (ADVIL,MOTRIN) 800 MG tablet Take 1 tablet (800 mg total) by mouth as needed. 30 tablet 0  . verapamil (CALAN-SR) 180 MG CR tablet Take 1 tablet (180 mg total) by mouth daily. 30 tablet 0  . warfarin (COUMADIN) 4 MG tablet Take 1 tablet (4 mg total) by mouth daily. Per 05/24/15 [facility] PT/INR check, instructed to take 4mg  every day EXCEPT for Tuesdays which should be 2 mg for that day. 30 tablet 0   No current facility-administered medications for this visit.     Patient has been obtaining prescriptions from [facility]. Now that he has a Medicare Advantage plan, we will no longer be able to provide assistance.  [name] is going to stay with his current Medicare Advantage Plan for 2018. Appointment was set up for 02-03-17 to review Medicare Advantage Plans for 2019.  Jaselle Pryer K. Joelene MillinHarrison, BS, PharmD [facility]Ph[facility]acy Operations Coordinator [phone]

## 2016-01-31 ENCOUNTER — Ambulatory Visit: Payer: Self-pay | Admitting: Internal Medicine

## 2016-01-31 ENCOUNTER — Other Ambulatory Visit: Payer: Self-pay

## 2016-01-31 DIAGNOSIS — M25562 Pain in left knee: Secondary | ICD-10-CM

## 2016-01-31 DIAGNOSIS — I2699 Other pulmonary embolism without acute cor pulmonale: Secondary | ICD-10-CM

## 2016-01-31 LAB — POCT INR: INR: 1.1

## 2016-02-14 ENCOUNTER — Telehealth: Payer: Self-pay

## 2016-02-14 ENCOUNTER — Other Ambulatory Visit: Payer: Self-pay

## 2016-02-14 DIAGNOSIS — Z86711 Personal history of pulmonary embolism: Secondary | ICD-10-CM

## 2016-02-14 LAB — POCT INR: INR: 1.1

## 2016-02-14 NOTE — Progress Notes (Unsigned)
Pt came in today for INR. Chaplin put pt on 6 mg on mondays and 4 mg all the other days. Repeat inr next week.

## 2016-02-14 NOTE — Telephone Encounter (Signed)
PT needed a ortho referral due to pt now having medicaid and is out of the 30 day window we can no longer do the referral. So I called [facility] where he had recently scheduled an appt with a doctor there and they changed his appt to Dec. 14 at 10:15 so he can get in sooner and get that referral. Pt verbalized understanding.

## 2016-02-21 ENCOUNTER — Other Ambulatory Visit: Payer: Self-pay

## 2016-02-21 DIAGNOSIS — I2699 Other pulmonary embolism without acute cor pulmonale: Secondary | ICD-10-CM

## 2016-02-21 DIAGNOSIS — M545 Low back pain: Secondary | ICD-10-CM

## 2016-02-21 DIAGNOSIS — Z0189 Encounter for other specified special examinations: Secondary | ICD-10-CM

## 2016-02-21 LAB — POCT INR: INR: 2.5

## 2016-02-21 MED ORDER — WARFARIN SODIUM 4 MG PO TABS: 4.0000 mg | ORAL_TABLET | Freq: Every day | ORAL | 0 refills | Status: DC

## 2016-02-21 MED ORDER — IBUPROFEN 800 MG PO TABS: 800.0000 mg | ORAL_TABLET | ORAL | 0 refills | Status: DC | PRN

## 2016-02-21 NOTE — Progress Notes (Unsigned)
Per Dr Candelaria Stagerschaplin pt needs to take same dose 4 mg everyday except mondays take 6 mg. Repeat inr in 2 weeks. Dr Candelaria Stagerschaplin agreed to a RX of ibuprofen gave pt risk factors. Pt verbalized understanding.

## 2016-03-06 ENCOUNTER — Other Ambulatory Visit: Payer: Self-pay

## 2016-03-06 DIAGNOSIS — I2782 Chronic pulmonary embolism: Secondary | ICD-10-CM

## 2016-03-06 LAB — POCT INR: INR: 2.6

## 2016-03-06 NOTE — Progress Notes (Unsigned)
Chaplin wants pt to stay on same dose. RTC in 4 wks.

## 2016-03-14 DIAGNOSIS — I10 Essential (primary) hypertension: Secondary | ICD-10-CM | POA: Insufficient documentation

## 2016-03-14 DIAGNOSIS — I825Y9 Chronic embolism and thrombosis of unspecified deep veins of unspecified proximal lower extremity: Secondary | ICD-10-CM | POA: Insufficient documentation

## 2016-07-17 ENCOUNTER — Other Ambulatory Visit: Payer: Self-pay | Admitting: Orthopedic Surgery

## 2016-07-17 DIAGNOSIS — G8929 Other chronic pain: Secondary | ICD-10-CM

## 2016-07-17 DIAGNOSIS — M25562 Pain in left knee: Principal | ICD-10-CM

## 2016-07-22 ENCOUNTER — Ambulatory Visit
Admission: RE | Admit: 2016-07-22 | Discharge: 2016-07-22 | Disposition: A | Discharge status: Home / Self Care | Payer: Medicare Other | Source: Ambulatory Visit | Attending: Orthopedic Surgery | Admitting: Orthopedic Surgery

## 2016-07-22 DIAGNOSIS — M23222 Derangement of posterior horn of medial meniscus due to old tear or injury, left knee: Secondary | ICD-10-CM | POA: Insufficient documentation

## 2016-07-22 DIAGNOSIS — G8929 Other chronic pain: Secondary | ICD-10-CM

## 2016-07-22 DIAGNOSIS — M25462 Effusion, left knee: Secondary | ICD-10-CM | POA: Insufficient documentation

## 2016-07-22 DIAGNOSIS — M2392 Unspecified internal derangement of left knee: Secondary | ICD-10-CM | POA: Insufficient documentation

## 2016-07-22 DIAGNOSIS — M25562 Pain in left knee: Secondary | ICD-10-CM | POA: Diagnosis present

## 2016-07-22 DIAGNOSIS — M23252 Derangement of posterior horn of lateral meniscus due to old tear or injury, left knee: Secondary | ICD-10-CM | POA: Diagnosis not present

## 2016-08-19 ENCOUNTER — Encounter
Admission: RE | Admit: 2016-08-19 | Discharge: 2016-08-19 | Disposition: A | Discharge status: Home / Self Care | Payer: Medicare Other | Source: Ambulatory Visit | Attending: Orthopedic Surgery | Admitting: Orthopedic Surgery

## 2016-08-19 DIAGNOSIS — R9431 Abnormal electrocardiogram [ECG] [EKG]: Secondary | ICD-10-CM | POA: Diagnosis not present

## 2016-08-19 DIAGNOSIS — Z7901 Long term (current) use of anticoagulants: Secondary | ICD-10-CM | POA: Diagnosis not present

## 2016-08-19 DIAGNOSIS — Z01810 Encounter for preprocedural cardiovascular examination: Secondary | ICD-10-CM | POA: Insufficient documentation

## 2016-08-19 DIAGNOSIS — I10 Essential (primary) hypertension: Secondary | ICD-10-CM | POA: Insufficient documentation

## 2016-08-19 DIAGNOSIS — Z01812 Encounter for preprocedural laboratory examination: Secondary | ICD-10-CM | POA: Diagnosis not present

## 2016-08-19 DIAGNOSIS — Z86711 Personal history of pulmonary embolism: Secondary | ICD-10-CM | POA: Insufficient documentation

## 2016-08-19 LAB — BASIC METABOLIC PANEL
ANION GAP: 6 (ref 5–15)
BUN: 7 mg/dL (ref 6–20)
CO2: 24 mmol/L (ref 22–32)
Calcium: 9 mg/dL (ref 8.9–10.3)
Chloride: 110 mmol/L (ref 101–111)
Creatinine, Ser: 1.07 mg/dL (ref 0.61–1.24)
Glucose, Bld: 107 mg/dL — ABNORMAL HIGH (ref 65–99)
POTASSIUM: 3.7 mmol/L (ref 3.5–5.1)
SODIUM: 140 mmol/L (ref 135–145)

## 2016-08-19 LAB — CBC
HCT: 38.2 % — ABNORMAL LOW (ref 40.0–52.0)
Hemoglobin: 12.3 g/dL — ABNORMAL LOW (ref 13.0–18.0)
MCH: 25.2 pg — ABNORMAL LOW (ref 26.0–34.0)
MCHC: 32.3 g/dL (ref 32.0–36.0)
MCV: 78 fL — ABNORMAL LOW (ref 80.0–100.0)
PLATELETS: 205 10*3/uL (ref 150–440)
RBC: 4.9 MIL/uL (ref 4.40–5.90)
RDW: 20 % — AB (ref 11.5–14.5)
WBC: 3 10*3/uL — AB (ref 3.8–10.6)

## 2016-08-19 NOTE — Pre-Procedure Instructions (Addendum)
NOTIFIED ELAINE DR HOOTEN'S NURSE OF ELEVATED BP AND PATIENT DID NOT TAKE BP MEDICINE THIS AM.SAID HE COULD NOT PAY COPAY TO SEE PCP BEFORE SURGERY. LEFT BEFORE SIGNING PREOP INSTRUCTIONS. WILL TRY TO NOTIFY AT HOME NUMBER

## 2016-08-19 NOTE — Patient Instructions (Signed)
  Your procedure is scheduled on:08/23/16 Report to Day Surgery. [facility] To find out your arrival time please call [phone] between 1PM - 3PM on 08/22/16 Remember: Instructions that are not followed completely may result in serious medical risk, up to and including death, or upon the discretion of your surgeon and anesthesiologist your surgery may need to be rescheduled.    _X___ 1. Do not eat food or drink liquids after midnight. No gum chewing or hard candies.     __X__ 2. No Alcohol for 24 hours before or after surgery.   __X__ 3. Do Not Smoke For 24 Hours Prior to Your Surgery.   ____ 4. Bring all medications with you on the day of surgery if instructed.    ___X_ 5. Notify your doctor if there is any change in your medical condition     (cold, fever, infections).       Do not wear jewelry, make-up, hairpins, clips or nail polish.  Do not wear lotions, powders, or perfumes. You may wear deodorant.  Do not shave 48 hours prior to surgery. Men may shave face and neck.  Do not bring valuables to the hospital.    [facility] any belongings or valuables.               Contacts, dentures or bridgework may not be worn into surgery.  Leave your suitcase in the car. After surgery it may be brought to your room.  For patients admitted to the hospital, discharge time is determined by your                treatment team.   Patients discharged the day of surgery will not be allowed to drive home.   Please read over the following fact sheets that you were given:   Surgical Site Infection Prevention   __X__ Take these medicines the morning of surgery with A SIP OF WATER:    1. CARVEDILOL  2.   3.   4.  5.  6.  ____ Fleet Enema (as directed)   X____ Use CHG Soap as directed  ____ Use inhalers on the day of surgery  ____ Stop metformin 2 days prior to surgery    ____ Take 1/2 of usual insulin dose the night before surgery and none on the  morning of surgery.   ____ Stop Coumadin/Plavix/aspirin on    ALREADY STOPPED  ____ Stop Anti-inflammatories on  ALREADY STOPPED ____ Stop supplements until after surgery.    ____ Bring C-Pap to the hospital.

## 2016-08-21 NOTE — Progress Notes (Signed)
Progress note from Dr. Ewell PoeAnderson's office 08/21/16 in chart. Coreg to be increased.

## 2016-08-21 NOTE — Pre-Procedure Instructions (Signed)
ELAINE AT DR HOOTEN'S STATES BP STILL ELEVATED AND SEEING DR Dareen PianoANDERSON TODAY.

## 2016-08-22 NOTE — Pre-Procedure Instructions (Signed)
CLEARED BY PCP PA. 08/21/16. COREG INCREASED TO BID

## 2016-08-23 ENCOUNTER — Encounter: Payer: Self-pay | Admitting: Orthopedic Surgery

## 2016-08-23 ENCOUNTER — Ambulatory Visit: Payer: Medicare Other | Admitting: Anesthesiology

## 2016-08-23 ENCOUNTER — Encounter
Admission: RE | Disposition: A | Discharge status: Home / Self Care | Payer: Self-pay | Source: Ambulatory Visit | Attending: Orthopedic Surgery

## 2016-08-23 ENCOUNTER — Ambulatory Visit
Admission: RE | Admit: 2016-08-23 | Discharge: 2016-08-23 | Disposition: A | Discharge status: Home / Self Care | Payer: Medicare Other | Source: Ambulatory Visit | Attending: Orthopedic Surgery | Admitting: Orthopedic Surgery

## 2016-08-23 DIAGNOSIS — Z86711 Personal history of pulmonary embolism: Secondary | ICD-10-CM | POA: Diagnosis not present

## 2016-08-23 DIAGNOSIS — Z86718 Personal history of other venous thrombosis and embolism: Secondary | ICD-10-CM | POA: Insufficient documentation

## 2016-08-23 DIAGNOSIS — I10 Essential (primary) hypertension: Secondary | ICD-10-CM | POA: Insufficient documentation

## 2016-08-23 DIAGNOSIS — M23252 Derangement of posterior horn of lateral meniscus due to old tear or injury, left knee: Secondary | ICD-10-CM | POA: Diagnosis not present

## 2016-08-23 DIAGNOSIS — M94262 Chondromalacia, left knee: Secondary | ICD-10-CM | POA: Diagnosis not present

## 2016-08-23 DIAGNOSIS — M23222 Derangement of posterior horn of medial meniscus due to old tear or injury, left knee: Secondary | ICD-10-CM | POA: Diagnosis not present

## 2016-08-23 DIAGNOSIS — Z7901 Long term (current) use of anticoagulants: Secondary | ICD-10-CM | POA: Insufficient documentation

## 2016-08-23 DIAGNOSIS — Z79899 Other long term (current) drug therapy: Secondary | ICD-10-CM | POA: Diagnosis not present

## 2016-08-23 DIAGNOSIS — S83282A Other tear of lateral meniscus, current injury, left knee, initial encounter: Secondary | ICD-10-CM | POA: Diagnosis present

## 2016-08-23 HISTORY — PX: KNEE ARTHROSCOPY: SHX127

## 2016-08-23 LAB — PROTIME-INR
INR: 1.38
Prothrombin Time: 17.1 seconds — ABNORMAL HIGH (ref 11.4–15.2)

## 2016-08-23 SURGERY — ARTHROSCOPY, KNEE
Anesthesia: General | Site: Knee | Laterality: Left | Wound class: Clean

## 2016-08-23 MED ORDER — EPHEDRINE SULFATE 50 MG/ML IJ SOLN
INTRAMUSCULAR | Status: DC | PRN
  Administered 2016-08-23 (×2): 10 mg via INTRAVENOUS

## 2016-08-23 MED ORDER — FENTANYL CITRATE (PF) 100 MCG/2ML IJ SOLN
INTRAMUSCULAR | Status: AC
Start: 2016-08-23 — End: ?
  Filled 2016-08-23: qty 2

## 2016-08-23 MED ORDER — DEXAMETHASONE SODIUM PHOSPHATE 10 MG/ML IJ SOLN
INTRAMUSCULAR | Status: DC | PRN
  Administered 2016-08-23: 10 mg via INTRAVENOUS

## 2016-08-23 MED ORDER — FENTANYL CITRATE (PF) 100 MCG/2ML IJ SOLN
INTRAMUSCULAR | Status: DC | PRN
  Administered 2016-08-23 (×2): 25 ug via INTRAVENOUS
  Administered 2016-08-23: 50 ug via INTRAVENOUS

## 2016-08-23 MED ORDER — ONDANSETRON HCL 4 MG/2ML IJ SOLN
INTRAMUSCULAR | Status: AC
  Filled 2016-08-23: qty 2

## 2016-08-23 MED ORDER — PROMETHAZINE HCL 25 MG/ML IJ SOLN: 6.2500 mg | INTRAMUSCULAR | Status: DC | PRN

## 2016-08-23 MED ORDER — ACETAMINOPHEN 10 MG/ML IV SOLN
INTRAVENOUS | Status: AC
  Filled 2016-08-23: qty 100

## 2016-08-23 MED ORDER — MEPERIDINE HCL 50 MG/ML IJ SOLN: 6.2500 mg | INTRAMUSCULAR | Status: DC | PRN

## 2016-08-23 MED ORDER — OXYCODONE HCL 5 MG/5ML PO SOLN: 5.0000 mg | Freq: Once | ORAL | Status: DC | PRN

## 2016-08-23 MED ORDER — ACETAMINOPHEN 10 MG/ML IV SOLN
INTRAVENOUS | Status: DC | PRN
  Administered 2016-08-23: 1000 mg via INTRAVENOUS

## 2016-08-23 MED ORDER — GLYCOPYRROLATE 0.2 MG/ML IJ SOLN
INTRAMUSCULAR | Status: AC
  Filled 2016-08-23: qty 1

## 2016-08-23 MED ORDER — FENTANYL CITRATE (PF) 100 MCG/2ML IJ SOLN
INTRAMUSCULAR | Status: AC
  Administered 2016-08-23: 25 ug via INTRAVENOUS
  Filled 2016-08-23: qty 2

## 2016-08-23 MED ORDER — BUPIVACAINE-EPINEPHRINE (PF) 0.25% -1:200000 IJ SOLN
INTRAMUSCULAR | Status: DC | PRN
  Administered 2016-08-23: 5 mL via PERINEURAL
  Administered 2016-08-23: 25 mL via PERINEURAL

## 2016-08-23 MED ORDER — MIDAZOLAM HCL 2 MG/2ML IJ SOLN
INTRAMUSCULAR | Status: AC
  Filled 2016-08-23: qty 2

## 2016-08-23 MED ORDER — FAMOTIDINE 20 MG PO TABS
20.0000 mg | ORAL_TABLET | Freq: Once | ORAL | Status: AC
Start: 2016-08-23 — End: 2016-08-23
  Administered 2016-08-23: 20 mg via ORAL

## 2016-08-23 MED ORDER — MIDAZOLAM HCL 5 MG/5ML IJ SOLN
INTRAMUSCULAR | Status: DC | PRN
  Administered 2016-08-23: 2 mg via INTRAVENOUS

## 2016-08-23 MED ORDER — PROPOFOL 10 MG/ML IV BOLUS
INTRAVENOUS | Status: DC | PRN
  Administered 2016-08-23: 150 mg via INTRAVENOUS

## 2016-08-23 MED ORDER — BUPIVACAINE-EPINEPHRINE (PF) 0.25% -1:200000 IJ SOLN
INTRAMUSCULAR | Status: AC
  Filled 2016-08-23: qty 30

## 2016-08-23 MED ORDER — SEVOFLURANE IN SOLN
RESPIRATORY_TRACT | Status: AC
  Filled 2016-08-23: qty 250

## 2016-08-23 MED ORDER — FENTANYL CITRATE (PF) 100 MCG/2ML IJ SOLN
25.0000 ug | INTRAMUSCULAR | Status: DC | PRN
  Administered 2016-08-23: 25 ug via INTRAVENOUS

## 2016-08-23 MED ORDER — LACTATED RINGERS IV SOLN
INTRAVENOUS | Status: DC
  Administered 2016-08-23: 75 mL/h via INTRAVENOUS
  Administered 2016-08-23: 09:00:00 via INTRAVENOUS

## 2016-08-23 MED ORDER — HYDROCODONE-ACETAMINOPHEN 5-325 MG PO TABS: 1.0000 | ORAL_TABLET | ORAL | 0 refills | Status: DC | PRN

## 2016-08-23 MED ORDER — FAMOTIDINE 20 MG PO TABS
ORAL_TABLET | ORAL | Status: AC
  Administered 2016-08-23: 20 mg via ORAL
  Filled 2016-08-23: qty 1

## 2016-08-23 MED ORDER — PHENYLEPHRINE 40 MCG/ML (10ML) SYRINGE FOR IV PUSH (FOR BLOOD PRESSURE SUPPORT)
PREFILLED_SYRINGE | INTRAVENOUS | Status: DC | PRN
  Administered 2016-08-23 (×6): 80 ug via INTRAVENOUS

## 2016-08-23 MED ORDER — MORPHINE SULFATE (PF) 4 MG/ML IV SOLN
INTRAVENOUS | Status: AC
  Filled 2016-08-23: qty 1

## 2016-08-23 MED ORDER — GLYCOPYRROLATE 0.2 MG/ML IJ SOLN
INTRAMUSCULAR | Status: DC | PRN
  Administered 2016-08-23: 0.2 mg via INTRAVENOUS

## 2016-08-23 MED ORDER — LIDOCAINE 2% (20 MG/ML) 5 ML SYRINGE
INTRAMUSCULAR | Status: DC | PRN
  Administered 2016-08-23: 100 mg via INTRAVENOUS

## 2016-08-23 MED ORDER — CHLORHEXIDINE GLUCONATE 4 % EX LIQD: 60.0000 mL | Freq: Once | CUTANEOUS | Status: DC

## 2016-08-23 MED ORDER — MORPHINE SULFATE 4 MG/ML IJ SOLN
INTRAMUSCULAR | Status: DC | PRN
  Administered 2016-08-23: 4 mg

## 2016-08-23 MED ORDER — OXYCODONE HCL 5 MG PO TABS: 5.0000 mg | ORAL_TABLET | Freq: Once | ORAL | Status: DC | PRN

## 2016-08-23 MED ORDER — ONDANSETRON HCL 4 MG/2ML IJ SOLN
INTRAMUSCULAR | Status: DC | PRN
  Administered 2016-08-23: 4 mg via INTRAVENOUS

## 2016-08-23 SURGICAL SUPPLY — 28 items
ADAPTER IRRIG TUBE 2 SPIKE SOL (ADAPTER) ×6 IMPLANT
BLADE SHAVER 4.5 DBL SERAT CV (CUTTER) IMPLANT
BNDG ESMARK 6X12 TAN STRL LF (GAUZE/BANDAGES/DRESSINGS) ×3 IMPLANT
CUFF TOURN 24 STER (MISCELLANEOUS) ×3 IMPLANT
CUFF TOURN 30 STER DUAL PORT (MISCELLANEOUS) IMPLANT
DECANTER SPIKE VIAL GLASS SM (MISCELLANEOUS) ×3 IMPLANT
DRSG DERMACEA 8X12 NADH (GAUZE/BANDAGES/DRESSINGS) ×3 IMPLANT
DURAPREP 26ML APPLICATOR (WOUND CARE) ×3 IMPLANT
GAUZE SPONGE 4X4 12PLY STRL (GAUZE/BANDAGES/DRESSINGS) ×3 IMPLANT
GLOVE BIOGEL M STRL SZ7.5 (GLOVE) ×12 IMPLANT
GLOVE INDICATOR 8.0 STRL GRN (GLOVE) ×9 IMPLANT
GOWN STRL REUS W/ TWL LRG LVL3 (GOWN DISPOSABLE) ×3 IMPLANT
GOWN STRL REUS W/TWL LRG LVL3 (GOWN DISPOSABLE) ×6
IV LACTATED RINGER IRRG 3000ML (IV SOLUTION) ×18
IV LR IRRIG 3000ML ARTHROMATIC (IV SOLUTION) ×9 IMPLANT
KIT RM TURNOVER STRD PROC AR (KITS) ×3 IMPLANT
MANIFOLD NEPTUNE II (INSTRUMENTS) ×3 IMPLANT
NEEDLE FILTER BLUNT 18X 1/2SAF (NEEDLE) ×2
NEEDLE FILTER BLUNT 18X1 1/2 (NEEDLE) ×1 IMPLANT
PACK ARTHROSCOPY KNEE (MISCELLANEOUS) ×3 IMPLANT
SET TUBE SUCT SHAVER OUTFL 24K (TUBING) ×3 IMPLANT
SET TUBE TIP INTRA-ARTICULAR (MISCELLANEOUS) ×3 IMPLANT
SUT ETHILON 3-0 FS-10 30 BLK (SUTURE) ×3
SUTURE EHLN 3-0 FS-10 30 BLK (SUTURE) ×1 IMPLANT
SYR 3ML LL SCALE MARK (SYRINGE) ×3 IMPLANT
TUBING ARTHRO INFLOW-ONLY STRL (TUBING) ×3 IMPLANT
WAND COBLATION FLOW 50 (SURGICAL WAND) ×3 IMPLANT
WRAP KNEE W/COLD PACKS 25.5X14 (SOFTGOODS) ×3 IMPLANT

## 2016-08-23 NOTE — Anesthesia Preprocedure Evaluation (Signed)
Anesthesia Evaluation  Patient identified by MRN, date of birth, ID band Patient awake    Reviewed: Allergy & Precautions, NPO status , Patient's Chart, lab work & pertinent test results  History of Anesthesia Complications Negative for: history of anesthetic complications  Airway Mallampati: II  TM Distance: >3 FB Neck ROM: Full    Dental  (+) Upper Dentures   Pulmonary neg pulmonary ROS, neg sleep apnea, neg COPD,    breath sounds clear to auscultation- rhonchi (-) wheezing      Cardiovascular Exercise Tolerance: Good hypertension, Pt. on medications (-) CAD, (-) Past MI and (-) Cardiac Stents  Rhythm:Regular Rate:Normal - Systolic murmurs and - Diastolic murmurs    Neuro/Psych negative neurological ROS  negative psych ROS   GI/Hepatic negative GI ROS, Neg liver ROS,   Endo/Other  negative endocrine ROSneg diabetes  Renal/GU negative Renal ROS     Musculoskeletal negative musculoskeletal ROS (+)   Abdominal (+) - obese,   Peds  Hematology negative hematology ROS (+)   Anesthesia Other Findings Past Medical History: 4 years ago: Blood clot in vein     Comment: [facility]               [facility].   No date: Gout No date: Hypertension   Reproductive/Obstetrics                             Anesthesia Physical Anesthesia Plan  ASA: II  Anesthesia Plan: General   Post-op Pain Management:    Induction: Intravenous  Airway Management Planned: LMA  Additional Equipment:   Intra-op Plan:   Post-operative Plan:   Informed Consent: I have reviewed the patients History and Physical, chart, labs and discussed the procedure including the risks, benefits and alternatives for the proposed anesthesia with the patient or authorized representative who has indicated his/her understanding and acceptance.   Dental advisory given  Plan Discussed with: CRNA and  Anesthesiologist  Anesthesia Plan Comments:         Anesthesia Quick Evaluation

## 2016-08-23 NOTE — H&P (Signed)
The patient has been re-examined, and the chart reviewed, and there have been no interval changes to the documented history and physical.    The risks, benefits, and alternatives have been discussed at length. The patient expressed understanding of the risks benefits and agreed with plans for surgical intervention.  James P. Hooten, Jr. M.D.    

## 2016-08-23 NOTE — Anesthesia Postprocedure Evaluation (Signed)
Anesthesia Post Note  Patient: Clifford Farley  Procedure(s) Performed: Procedure(s) (LRB): ARTHROSCOPY KNEE WITH PARTIAL MEDIAL MENISECTOMY (Left)  Patient location during evaluation: PACU Anesthesia Type: General Level of consciousness: awake and alert and oriented Pain management: pain level controlled Vital Signs Assessment: post-procedure vital signs reviewed and stable Respiratory status: spontaneous breathing, nonlabored ventilation and respiratory function stable Cardiovascular status: blood pressure returned to baseline and stable Postop Assessment: no signs of nausea or vomiting Anesthetic complications: no     Last Vitals:  Vitals:   08/23/16 1143 08/23/16 1202  BP: (!) 141/100 (!) 151/100  Pulse: 79 76  Resp: 16 16  Temp: 36.5 C (!) 36 C    Last Pain:  Vitals:   08/23/16 1202  TempSrc: Temporal  PainSc:                  Leinani Lisbon

## 2016-08-23 NOTE — Anesthesia Procedure Notes (Signed)
Procedure Name: LMA Insertion Date/Time: 08/23/2016 9:07 AM Performed by: Paulette BlanchPARAS, Aime Meloche Pre-anesthesia Checklist: Patient identified, Patient being monitored, Timeout performed, Emergency Drugs available and Suction available Patient Re-evaluated:Patient Re-evaluated prior to inductionOxygen Delivery Method: Circle system utilized Preoxygenation: Pre-oxygenation with 100% oxygen Intubation Type: IV induction Ventilation: Mask ventilation without difficulty LMA: LMA inserted LMA Size: 4.5 Tube type: Oral Number of attempts: 1 Placement Confirmation: positive ETCO2 and breath sounds checked- equal and bilateral Tube secured with: Tape Dental Injury: Teeth and Oropharynx as per pre-operative assessment

## 2016-08-23 NOTE — Transfer of Care (Signed)
Immediate Anesthesia Transfer of Care Note  Patient: Clifford Farley  Procedure(s) Performed: Procedure(s): ARTHROSCOPY KNEE WITH PARTIAL MEDIAL MENISECTOMY (Left)  Patient Location: PACU  Anesthesia Type:General  Level of Consciousness: awake, alert  and oriented  Airway & Oxygen Therapy: Patient Spontanous Breathing and Patient connected to face mask oxygen  Post-op Assessment: Report given to RN and Post -op Vital signs reviewed and stable  Post vital signs: Reviewed and stable  Last Vitals:  Vitals:   08/23/16 0808 08/23/16 0830  BP: (!) 160/106 (!) 154/92  Pulse: 76   Resp: 16   Temp:      Last Pain:  Vitals:   08/23/16 0719  TempSrc: Tympanic  PainSc: 7          Complications: No apparent anesthesia complications

## 2016-08-23 NOTE — Anesthesia Post-op Follow-up Note (Cosign Needed)
Anesthesia QCDR form completed.        

## 2016-08-23 NOTE — Op Note (Signed)
OPERATIVE NOTE  DATE OF SURGERY:  08/23/2016  PATIENT NAME:  Clifford Farley   DOB: 03/19/1956  MRN: [id number]   PRE-OPERATIVE DIAGNOSIS:  Internal derangement of the left knee   POST-OPERATIVE DIAGNOSIS:   Tear of the posterior horn medial meniscus, left knee Tear of the posterior horn of the lateral meniscus (radial), left knee Crystalline arthropathy/chondromalacia  PROCEDURE:  Left knee arthroscopy, partial medial and lateral meniscectomies  SURGEON:  Jena GaussJames P Hooten, Jr., M.D.   ASSISTANT: none  ANESTHESIA: general  ESTIMATED BLOOD LOSS: Minimal  FLUIDS REPLACED: 1200 mL of crystalloid  TOURNIQUET TIME: Not used   DRAINS: none  IMPLANTS UTILIZED: None  INDICATIONS FOR SURGERY: Clifford Farley is a 61 y.o. year old male who has been seen for complaints of left knee pain. MRI demonstrated findings consistent with meniscal pathology. After discussion of the risks and benefits of surgical intervention, the patient expressed understanding of the risks benefits and agree with plans for left knee arthroscopy.   PROCEDURE IN DETAIL: The patient was brought into the operating room and, after adequate general anesthesia was achieved, a tourniquet was applied to the left thigh and the leg was placed in the leg holder. All bony prominences were well padded. The patient's left knee was cleaned and prepped with alcohol and Duraprep and draped in the usual sterile fashion. A "timeout" was performed as per usual protocol. The anticipated portal sites were injected with 0.25% Marcaine with epinephrine. An anterolateral incision was made and a cannula was inserted. A medium effusion was evacuated and the knee was distended with fluid using the pump. The scope was advanced down the medial gutter into the medial compartment. Under visualization with the scope, an anteromedial portal was created and a hooked probe was inserted. The medial meniscus was visualized and probed. There was a complex tear  of the posterior horn of the medial meniscus. The tear was debrided using meniscal punches and and contoured using the ArthroCare wand. The remaining rim meniscus was visualized and probed and felt to be stable. The articular cartilage was visualized. Diffuse white crystalline plaques were noted across the articular surface with some chondral changes. The frayed chondral regions were debrided using the ArthroCare wand.  The scope was then advanced into the intercondylar notch. The anterior cruciate ligament was visualized and probed and felt to be intact. The scope was removed from the lateral portal and reinserted via the anteromedial portal to better visualize the lateral compartment. The lateral meniscus was visualized and probed. There was a radial tear of the posterior horn of the lateral meniscus. The tear was debrided using meniscal punches and then contoured using the ArthroCare wand. The articular cartilage of the lateral compartment was visualized. Diffuse white crystalline plaques were also noted across the articular surface of the lateral compartment. Finally, the scope was advanced so as to visualize the patellofemoral articulation. Good patellar tracking was appreciated. Crystalline plaques were also noted both to the articular surface of the patella as well as along the trochlear groove. Evidence of synovitis was present in the suprapatellar pouch.  The knee was irrigated with copius amounts of fluid and suctioned dry. The anterolateral portal was re-approximated with #3-0 nylon. A combination of 0.25% Marcaine with epinephrine and 4 mg of Morphine were injected via the scope. The scope was removed and the anteromedial portal was re-approximated with #3-0 nylon. A sterile dressing was applied followed by application of an ice wrap.  The patient tolerated the procedure well and was transported to  the PACU in stable condition.  James P. Holley Bouche., M.D.

## 2016-08-23 NOTE — Discharge Instructions (Signed)
°  Instructions after Knee Arthroscopy    James P. Angie FavaHooten, Jr., M.D.     Dept. of Orthopaedics & Sports Medicine  [facility]  [address]  [place], [place]  [id number]   Phone: [phone]   Fax: [phone]   DIET:  Drink plenty of non-alcoholic fluids & begin a light diet.  Resume your normal diet the day after surgery.  ACTIVITY:   You may use crutches or a walker with weight-bearing as tolerated, unless instructed otherwise.  You may wean yourself off of the walker or crutches as tolerated.   Begin doing gentle exercises. Exercising will reduce the pain and swelling, increase motion, and prevent muscle weakness.    Avoid strenuous activities or athletics for a minimum of 4-6 weeks after arthroscopic surgery.  Do not drive or operate any equipment until instructed.  WOUND CARE:   Place one to two pillows under the knee the first day or two when sitting or lying.   Continue to use the ice packs periodically to reduce pain and swelling.  The small incisions in your knee are closed with nylon stitches. The stitches will be removed in the office.  The bulky dressing may be removed on the second day after surgery. DO NOT TOUCH THE STITCHES. Put a Band-Aid over each stitch. Do NOT use any ointments or creams on the incisions.   You may bathe or shower after the stitches are removed at the first office visit following surgery.  MEDICATIONS:  You may resume your regular medications.  Please take the pain medication as prescribed.  Do not take pain medication on an empty stomach.  Do not drive or drink alcoholic beverages when taking pain medications.  CALL THE OFFICE FOR:  Temperature above 101 degrees  Excessive bleeding or drainage on the dressing.  Excessive swelling, coldness, or paleness of the toes.  Persistent nausea and vomiting.  FOLLOW-UP:   You should have an appointment to return to the office in 7-10 days after surgery.    AMBULATORY  SURGERY  DISCHARGE INSTRUCTIONS   1) The drugs that you were given will stay in your system until tomorrow so for the next 24 hours you should not:  A) Drive an automobile B) Make any legal decisions C) Drink any alcoholic beverage   2) You may resume regular meals tomorrow.  Today it is better to start with liquids and gradually work up to solid foods.  You may eat anything you prefer, but it is better to start with liquids, then soup and crackers, and gradually work up to solid foods.   3) Please notify your doctor immediately if you have any unusual bleeding, trouble breathing, redness and pain at the surgery site, drainage, fever, or pain not relieved by medication.   4) Additional Instructions: RICE, R=rest, I=ice, C=compression, E=Elevation.

## 2016-08-24 ENCOUNTER — Encounter: Payer: Self-pay | Admitting: Orthopedic Surgery

## 2016-11-27 DIAGNOSIS — Z0000 Encounter for general adult medical examination without abnormal findings: Secondary | ICD-10-CM | POA: Insufficient documentation

## 2016-11-27 DIAGNOSIS — E118 Type 2 diabetes mellitus with unspecified complications: Secondary | ICD-10-CM | POA: Insufficient documentation

## 2016-11-27 DIAGNOSIS — R739 Hyperglycemia, unspecified: Secondary | ICD-10-CM | POA: Insufficient documentation

## 2016-12-10 DIAGNOSIS — M65941 Unspecified synovitis and tenosynovitis, right hand: Secondary | ICD-10-CM | POA: Insufficient documentation

## 2016-12-10 DIAGNOSIS — M1A00X0 Idiopathic chronic gout, unspecified site, without tophus (tophi): Secondary | ICD-10-CM | POA: Insufficient documentation

## 2016-12-10 DIAGNOSIS — M659 Synovitis and tenosynovitis, unspecified: Secondary | ICD-10-CM | POA: Insufficient documentation

## 2016-12-24 ENCOUNTER — Emergency Department: Payer: Medicare Other

## 2016-12-24 ENCOUNTER — Encounter: Payer: Self-pay | Admitting: Emergency Medicine

## 2016-12-24 ENCOUNTER — Emergency Department
Admission: EM | Admit: 2016-12-24 | Discharge: 2016-12-24 | Disposition: A | Discharge status: Home / Self Care | Payer: Medicare Other | Attending: Emergency Medicine | Admitting: Emergency Medicine

## 2016-12-24 DIAGNOSIS — M109 Gout, unspecified: Secondary | ICD-10-CM | POA: Diagnosis not present

## 2016-12-24 DIAGNOSIS — Z7901 Long term (current) use of anticoagulants: Secondary | ICD-10-CM | POA: Insufficient documentation

## 2016-12-24 DIAGNOSIS — R1111 Vomiting without nausea: Secondary | ICD-10-CM

## 2016-12-24 DIAGNOSIS — R52 Pain, unspecified: Secondary | ICD-10-CM

## 2016-12-24 DIAGNOSIS — M2550 Pain in unspecified joint: Secondary | ICD-10-CM | POA: Diagnosis not present

## 2016-12-24 DIAGNOSIS — I10 Essential (primary) hypertension: Secondary | ICD-10-CM | POA: Diagnosis not present

## 2016-12-24 DIAGNOSIS — R5381 Other malaise: Secondary | ICD-10-CM | POA: Insufficient documentation

## 2016-12-24 DIAGNOSIS — Z79899 Other long term (current) drug therapy: Secondary | ICD-10-CM | POA: Insufficient documentation

## 2016-12-24 LAB — COMPREHENSIVE METABOLIC PANEL
ALBUMIN: 3.8 g/dL (ref 3.5–5.0)
ALK PHOS: 69 U/L (ref 38–126)
ALT: 10 U/L — ABNORMAL LOW (ref 17–63)
ANION GAP: 8 (ref 5–15)
AST: 17 U/L (ref 15–41)
BILIRUBIN TOTAL: 0.6 mg/dL (ref 0.3–1.2)
BUN: 11 mg/dL (ref 6–20)
CALCIUM: 9 mg/dL (ref 8.9–10.3)
CO2: 22 mmol/L (ref 22–32)
Chloride: 108 mmol/L (ref 101–111)
Creatinine, Ser: 1.21 mg/dL (ref 0.61–1.24)
GFR calc Af Amer: >60 mL/min (ref >60)
GFR calc non Af Amer: >60 mL/min (ref >60)
GLUCOSE: 119 mg/dL — AB (ref 65–99)
Potassium: 3.8 mmol/L (ref 3.5–5.1)
Sodium: 138 mmol/L (ref 135–145)
TOTAL PROTEIN: 7.3 g/dL (ref 6.5–8.1)

## 2016-12-24 LAB — CBC WITH DIFFERENTIAL/PLATELET
BASOS PCT: 1 %
Basophils Absolute: 0 10*3/uL (ref 0–0.1)
Eosinophils Absolute: 0 10*3/uL (ref 0–0.7)
Eosinophils Relative: 0 %
HEMATOCRIT: 38.6 % — AB (ref 40.0–52.0)
Hemoglobin: 12.7 g/dL — ABNORMAL LOW (ref 13.0–18.0)
LYMPHS ABS: 1.1 10*3/uL (ref 1.0–3.6)
LYMPHS PCT: 16 %
MCH: 26.1 pg (ref 26.0–34.0)
MCHC: 32.9 g/dL (ref 32.0–36.0)
MCV: 79.2 fL — AB (ref 80.0–100.0)
MONOS PCT: 9 %
Monocytes Absolute: 0.6 10*3/uL (ref 0.2–1.0)
NEUTROS ABS: 4.8 10*3/uL (ref 1.4–6.5)
NEUTROS PCT: 74 %
Platelets: 195 10*3/uL (ref 150–440)
RBC: 4.87 MIL/uL (ref 4.40–5.90)
RDW: 17.7 % — ABNORMAL HIGH (ref 11.5–14.5)
WBC: 6.4 10*3/uL (ref 3.8–10.6)

## 2016-12-24 LAB — CK: Total CK: 60 U/L (ref 49–397)

## 2016-12-24 LAB — PROTIME-INR
INR: 1.28
PROTHROMBIN TIME: 15.9 s — AB (ref 11.4–15.2)

## 2016-12-24 LAB — SEDIMENTATION RATE: Sed Rate: 45 mm/hr — ABNORMAL HIGH (ref 0–20)

## 2016-12-24 MED ORDER — OXYCODONE-ACETAMINOPHEN 5-325 MG PO TABS
1.0000 | ORAL_TABLET | Freq: Once | ORAL | Status: AC
  Administered 2016-12-24: 1 via ORAL
  Filled 2016-12-24: qty 1

## 2016-12-24 MED ORDER — ONDANSETRON HCL 4 MG PO TABS
4.0000 mg | ORAL_TABLET | Freq: Once | ORAL | Status: AC
  Administered 2016-12-24: 4 mg via ORAL
  Filled 2016-12-24: qty 1

## 2016-12-24 MED ORDER — OXYCODONE-ACETAMINOPHEN 5-325 MG PO TABS: 1.0000 | ORAL_TABLET | Freq: Four times a day (QID) | ORAL | 0 refills | Status: DC | PRN

## 2016-12-24 MED ORDER — ONDANSETRON HCL 4 MG PO TABS: 4.0000 mg | ORAL_TABLET | Freq: Every day | ORAL | 0 refills | Status: DC | PRN

## 2016-12-24 NOTE — ED Provider Notes (Signed)
[facility] Emergency Department Provider Note  ____________________________________________   First MD Initiated Contact with Patient 12/24/16 1615     (approximate)  I have reviewed the triage vital signs and the nursing notes.   HISTORY  Chief Complaint Joint Pain and Emesis   HPI Clifford Farley is a 61 y.o. male with a history of DVT, PE as well as gout who is presenting to the emergency department today with 2 weeks of worsening joint pain which is worst to his right hand. He says that he has had swelling to his right hand and was seen by rheumatologist about 2 weeks ago. He was prescribed a Medrol Dosepak as well as allopurinol. However, he said that after starting take allopurinol the pain became worse and now has diffuse joint aching which is worse to his right hand as well as his right great toe. He also has noted swelling to his right hand. He said he also had an episode of vomiting earlier today when he was evaluated at the [facility] clinic and therefore was sent to the emergency department for further evaluation.patient is denying any nausea at this time.   Past Medical History:  Diagnosis Date  . Blood clot in vein 4 years ago   Location of clot unspecified in [facility] paper chart.    . Gout   . Hypertension     Patient Active Problem List   Diagnosis Date Noted  . Protime monitoring 03/08/2015  . Pulmonary embolism (HCC) 12/07/2014  . Hypertension 09/30/2012    Past Surgical History:  Procedure Laterality Date  . FRACTURE SURGERY     wrist  . KNEE ARTHROSCOPY Left 08/23/2016   Procedure: ARTHROSCOPY KNEE WITH PARTIAL MEDIAL MENISECTOMY;  Surgeon: Donato Heinz, MD;  Location: [facility] ORS;  Service: Orthopedics;  Laterality: Left;    Prior to Admission medications   Medication Sig Start Date End Date Taking? Authorizing Provider  carvedilol (COREG) 3.125 MG tablet Take 3.125 mg by mouth 2 (two) times daily. 03/14/16 03/14/17  [provider]  HYDROcodone-acetaminophen (NORCO) 5-325 MG tablet Take 1-2 tablets by mouth every 4 (four) hours as needed for moderate pain. 08/23/16   Hooten, Illene Labrador, MD  ibuprofen (ADVIL,MOTRIN) 800 MG tablet Take 1 tablet (800 mg total) by mouth as needed. Patient not taking: Reported on 08/16/2016 02/21/16   Virl Axe, MD  verapamil (CALAN-SR) 180 MG CR tablet Take 1 tablet (180 mg total) by mouth daily. Patient not taking: Reported on 08/16/2016 01/24/16   Virl Axe, MD  warfarin (COUMADIN) 1 MG tablet Take 3 mg by mouth daily. Stopped for surgery 07/11/16   [provider]    Allergies Patient has no known allergies.  History reviewed. No pertinent family history.  Social History Social History  Substance Use Topics  . Smoking status: Never Smoker  . Smokeless tobacco: Never Used  . Alcohol use No    Review of Systems  Constitutional: No fever/chills Eyes: No visual changes. ENT: No sore throat. Cardiovascular: Denies chest pain. Respiratory: Denies shortness of breath. Gastrointestinal: No abdominal pain.  No diarrhea.  No constipation. Genitourinary: Negative for dysuria. Musculoskeletal: Negative for back pain. Skin: Negative for rash. Neurological: Negative for headaches, focal weakness or numbness.   ____________________________________________   PHYSICAL EXAM:  VITAL SIGNS: ED Triage Vitals  Enc Vitals Group     BP 12/24/16 1457 111/80     Pulse Rate 12/24/16 1457 85     Resp 12/24/16 1457 16  Temp 12/24/16 1457 97.8 F (36.6 C)     Temp Source 12/24/16 1457 Oral     SpO2 12/24/16 1457 95 %     Weight 12/24/16 1456 238 lb (108 kg)     Height 12/24/16 1456  (1.905 m)     Head Circumference --      Peak Flow --      Pain Score 12/24/16 1459 10     Pain Loc --      Pain Edu? --      Excl. in GC? --     Constitutional: Alert and oriented. Well appearing and in no acute distress. Eyes: Conjunctivae are normal.  Head:  Atraumatic. Nose: No congestion/rhinnorhea. Mouth/Throat: Mucous membranes are moist.  Neck: No stridor.   Cardiovascular: Normal rate, regular rhythm. Grossly normal heart sounds.  Good peripheral circulation with equal bilateral radial as well as dorsalis pedis pulses. Respiratory: Normal respiratory effort.  No retractions. Lungs CTAB. Gastrointestinal: Soft and nontender. No distention. No CVA tenderness. Musculoskeletal:  no edema  to the bilateral lower extremities. However, there is mild swelling to the right great toe with tenderness palpation over the MTP joint. There is also diffuse swelling which is nonedematous to the right hand from the wrist distally. There is no edema. Patient with pain with range of motion of the joints but without effusions. No redness or heat. Sensation isintact to light touch throughout. Neurologic:  Normal speech and language. No gross focal neurologic deficits are appreciated. Skin:  Skin is warm, dry and intact. No rash noted. Psychiatric: Mood and affect are normal. Speech and behavior are normal.  ____________________________________________   LABS (all labs ordered are listed, but only abnormal results are displayed)  Labs Reviewed  CBC WITH DIFFERENTIAL/PLATELET - Abnormal; Notable for the following:       Result Value   Hemoglobin 12.7 (*)    HCT 38.6 (*)    MCV 79.2 (*)    RDW 17.7 (*)    All other components within normal limits  COMPREHENSIVE METABOLIC PANEL - Abnormal; Notable for the following:    Glucose, Bld 119 (*)    ALT 10 (*)    All other components within normal limits  SEDIMENTATION RATE - Abnormal; Notable for the following:    Sed Rate 45 (*)    All other components within normal limits  PROTIME-INR - Abnormal; Notable for the following:    Prothrombin Time 15.9 (*)    All other components within normal limits  CK    ____________________________________________  EKG   ____________________________________________  RADIOLOGY  no evidence of DVT in the right upper extremity. ____________________________________________   PROCEDURES  Procedure(s) performed:   Procedures  Critical Care performed:   ____________________________________________   INITIAL IMPRESSION / ASSESSMENT AND PLAN / ED COURSE  Pertinent labs & imaging results that were available during my care of the patient were reviewed by me and considered in my medical decision making (see chart for details).  ----------------------------------------- 6:53 PM on 12/24/2016 -----------------------------------------  Patient is pain-free at this time. Able to range his extremities fully. He'll be discharged with a short course of Percocet as well as Zofran. He'll be following up with his rheumatologist. I think gout flare. He is understanding of the plan and willing to comply.      ____________________________________________   FINAL CLINICAL IMPRESSION(S) / ED DIAGNOSES  Final diagnoses:  Pain   gout. Joint pain.   NEW MEDICATIONS STARTED DURING THIS VISIT:  New Prescriptions  No medications on file     Note:  This document was prepared using Dragon voice recognition software and may include unintentional dictation errors.     Myrna Blazer, MD 12/24/16 [phone]

## 2016-12-24 NOTE — ED Triage Notes (Signed)
Pt to ED c/o bilateral arm and leg joint pain.  States was seen by primary 2 weeks ago and prescribed medication for gout but did not take the medication because states it did not feel like gout pain.  Patient states nausea and an episode of vomiting today d/t severe pain.

## 2017-02-03 ENCOUNTER — Encounter: Payer: Self-pay | Admitting: Pharmacist

## 2017-05-25 ENCOUNTER — Encounter: Payer: Self-pay | Admitting: Emergency Medicine

## 2017-05-25 ENCOUNTER — Emergency Department
Admission: EM | Admit: 2017-05-25 | Discharge: 2017-05-25 | Disposition: A | Discharge status: Home / Self Care | Payer: Medicare Other | Attending: Emergency Medicine | Admitting: Emergency Medicine

## 2017-05-25 ENCOUNTER — Other Ambulatory Visit: Payer: Self-pay

## 2017-05-25 DIAGNOSIS — I10 Essential (primary) hypertension: Secondary | ICD-10-CM | POA: Diagnosis not present

## 2017-05-25 DIAGNOSIS — M79672 Pain in left foot: Secondary | ICD-10-CM | POA: Diagnosis present

## 2017-05-25 DIAGNOSIS — M109 Gout, unspecified: Secondary | ICD-10-CM | POA: Diagnosis not present

## 2017-05-25 DIAGNOSIS — M25532 Pain in left wrist: Secondary | ICD-10-CM | POA: Diagnosis not present

## 2017-05-25 LAB — COMPREHENSIVE METABOLIC PANEL
ALBUMIN: 3.7 g/dL (ref 3.5–5.0)
ALT: 8 U/L — ABNORMAL LOW (ref 17–63)
ANION GAP: 7 (ref 5–15)
AST: 15 U/L (ref 15–41)
Alkaline Phosphatase: 72 U/L (ref 38–126)
BUN: 11 mg/dL (ref 6–20)
CALCIUM: 8.7 mg/dL — AB (ref 8.9–10.3)
CO2: 26 mmol/L (ref 22–32)
Chloride: 104 mmol/L (ref 101–111)
Creatinine, Ser: 1.33 mg/dL — ABNORMAL HIGH (ref 0.61–1.24)
GFR calc non Af Amer: 56 mL/min — ABNORMAL LOW (ref >60)
GLUCOSE: 151 mg/dL — AB (ref 65–99)
POTASSIUM: 3.5 mmol/L (ref 3.5–5.1)
SODIUM: 137 mmol/L (ref 135–145)
Total Bilirubin: 1 mg/dL (ref 0.3–1.2)
Total Protein: 7 g/dL (ref 6.5–8.1)

## 2017-05-25 LAB — CBC
HEMATOCRIT: 37.9 % — AB (ref 40.0–52.0)
HEMOGLOBIN: 12.3 g/dL — AB (ref 13.0–18.0)
MCH: 25.9 pg — ABNORMAL LOW (ref 26.0–34.0)
MCHC: 32.5 g/dL (ref 32.0–36.0)
MCV: 79.6 fL — ABNORMAL LOW (ref 80.0–100.0)
Platelets: 179 10*3/uL (ref 150–440)
RBC: 4.76 MIL/uL (ref 4.40–5.90)
RDW: 17.7 % — ABNORMAL HIGH (ref 11.5–14.5)
WBC: 4 10*3/uL (ref 3.8–10.6)

## 2017-05-25 LAB — URIC ACID: URIC ACID, SERUM: 8.6 mg/dL — AB (ref 4.4–7.6)

## 2017-05-25 MED ORDER — COLCHICINE 0.6 MG PO TABS
1.2000 mg | ORAL_TABLET | Freq: Once | ORAL | Status: AC
  Administered 2017-05-25: 1.2 mg via ORAL
  Filled 2017-05-25: qty 2

## 2017-05-25 MED ORDER — COLCHICINE 0.6 MG PO TABS
0.6000 mg | ORAL_TABLET | Freq: Once | ORAL | Status: AC
  Administered 2017-05-25: 0.6 mg via ORAL
  Filled 2017-05-25: qty 1

## 2017-05-25 MED ORDER — COLCHICINE 0.6 MG PO TABS: 1.2000 mg | ORAL_TABLET | Freq: Once | ORAL | 0 refills | Status: DC

## 2017-05-25 NOTE — ED Notes (Signed)
EMS pt from home to lobby, Left foot pain /swelling x1 day , left hand pain x2 months

## 2017-05-25 NOTE — Discharge Instructions (Signed)
Please schedule follow-up appointment with your primary care provider for refills of all of your medications. Take the colchicine as prescribed. Return to the emergency department for symptoms of change or worsen if you are unable to schedule an appointment.

## 2017-05-25 NOTE — ED Triage Notes (Signed)
Pt reports that yesterday he developed foot pain and left hand pain. No injury known to either one. Pt reports that he does have history of gout but doesn't feel like it. Pulses present

## 2017-05-25 NOTE — ED Notes (Signed)
FN: pt to ed via ems with reports left wrist pain and left foot pain, pt with hx of gout and does not take his meds, controls by diet. Pt also with hx of HTN but is out of meds. 178/98, 60, 99 t.

## 2017-05-25 NOTE — ED Provider Notes (Signed)
[facility] Emergency Department Provider Note ____________________________________________  Time seen: Approximately 1:00 PM  I have reviewed the triage vital signs and the nursing notes.   HISTORY  Chief Complaint Foot Pain    HPI Clifford Farley is a 62 y.o. male who presents to the emergency department for treatment and evaluation of left wrist and left foot pain. Patient has a history of gout, but this feels different. As of late, he has begun a job where he has to walk quite a bit on concrete floors and has been doing a job that requires repetitive motion.  He denies injury.  He typically controls his gout with diet.  Left foot pain started approximately 1 day ago, however the left hand has been painful intermittently for the past 2 months.  Patient is out of his hypertension and gout medications.  Past Medical History:  Diagnosis Date  . Blood clot in vein 4 years ago   Location of clot unspecified in [facility] paper chart.    . Gout   . Hypertension     Patient Active Problem List   Diagnosis Date Noted  . Protime monitoring 03/08/2015  . Pulmonary embolism (HCC) 12/07/2014  . Hypertension 09/30/2012    Past Surgical History:  Procedure Laterality Date  . FRACTURE SURGERY     wrist  . KNEE ARTHROSCOPY Left 08/23/2016   Procedure: ARTHROSCOPY KNEE WITH PARTIAL MEDIAL MENISECTOMY;  Surgeon: Donato Heinz, MD;  Location: [facility] ORS;  Service: Orthopedics;  Laterality: Left;    Prior to Admission medications   Medication Sig Start Date End Date Taking? Authorizing Provider  carvedilol (COREG) 3.125 MG tablet Take 3.125 mg by mouth 2 (two) times daily. 03/14/16 03/14/17  [provider]  colchicine 0.6 MG tablet Take 2 tablets (1.2 mg total) by mouth once for 1 dose. If pain persists after 1 hour, take 1 additional pill. No more than 3 pills in 24 hours. 05/25/17 05/25/17  Felisa Zechman, Kasandra Knudsen, FNP  HYDROcodone-acetaminophen (NORCO) 5-325 MG tablet  Take 1-2 tablets by mouth every 4 (four) hours as needed for moderate pain. 08/23/16   Hooten, Illene Labrador, MD  ibuprofen (ADVIL,MOTRIN) 800 MG tablet Take 1 tablet (800 mg total) by mouth as needed. Patient not taking: Reported on 08/16/2016 02/21/16   Virl Axe, MD  ondansetron (ZOFRAN) 4 MG tablet Take 1 tablet (4 mg total) by mouth daily as needed. 12/24/16   Schaevitz, Myra Rude, MD  oxyCODONE-acetaminophen (ROXICET) 5-325 MG tablet Take 1-2 tablets by mouth every 6 (six) hours as needed. 12/24/16   Schaevitz, Myra Rude, MD  verapamil (CALAN-SR) 180 MG CR tablet Take 1 tablet (180 mg total) by mouth daily. Patient not taking: Reported on 08/16/2016 01/24/16   Virl Axe, MD  warfarin (COUMADIN) 1 MG tablet Take 3 mg by mouth daily. Stopped for surgery 07/11/16   [provider]    Allergies Patient has no known allergies.  History reviewed. No pertinent family history.  Social History Social History   Tobacco Use  . Smoking status: Never Smoker  . Smokeless tobacco: Never Used  Substance Use Topics  . Alcohol use: No  . Drug use: No    Review of Systems Constitutional: Negative for fever or recent illness Cardiovascular: Negative for chest pain Respiratory: Negative for shortness of breath Musculoskeletal: Positive for left hand and left foot pain Skin: Positive for warm and mild erythema over the left hand and left foot. Neurological: Negative for paresthesias.  ____________________________________________  PHYSICAL EXAM:  VITAL SIGNS: ED Triage Vitals  Enc Vitals Group     BP 05/25/17 1121 (!) 152/100     Pulse Rate 05/25/17 1121 90     Resp --      Temp 05/25/17 1121 99.1 F (37.3 C)     Temp Source 05/25/17 1121 Oral     SpO2 05/25/17 1121 96 %     Weight 05/25/17 1122 245 lb (111.1 kg)     Height 05/25/17 1122 6\' 3"  (1.905 m)     Head Circumference --      Peak Flow --      Pain Score 05/25/17 1122 8     Pain Loc --      Pain Edu? --       Excl. in GC? --     Constitutional: Alert and oriented. Well appearing and in no acute distress. Eyes: Conjunctivae are clear without discharge or drainage Head: Atraumatic Neck: Supple Respiratory: Respirations are even and unlabored. Musculoskeletal: Left wrist is mildly edematous, but patient has full active range of motion.  Left foot has some tenderness and mild swelling over the lateral aspect, but also has full range of motion. Neurologic: Motor and sensory function is intact. Skin: Skin overlying the joint of the left wrist and left foot is erythematous, mildly edematous, and warm. Psychiatric: Affect and behavior are appropriate.  ____________________________________________   LABS (all labs ordered are listed, but only abnormal results are displayed)  Labs Reviewed  URIC ACID - Abnormal; Notable for the following components:      Result Value   Uric Acid, Serum 8.6 (*)    All other components within normal limits  COMPREHENSIVE METABOLIC PANEL - Abnormal; Notable for the following components:   Glucose, Bld 151 (*)    Creatinine, Ser 1.33 (*)    Calcium 8.7 (*)    ALT 8 (*)    GFR calc non Af Amer 56 (*)    All other components within normal limits  CBC - Abnormal; Notable for the following components:   Hemoglobin 12.3 (*)    HCT 37.9 (*)    MCV 79.6 (*)    MCH 25.9 (*)    RDW 17.7 (*)    All other components within normal limits   ____________________________________________  RADIOLOGY  Not indicated ____________________________________________   PROCEDURES  Procedures  ____________________________________________   INITIAL IMPRESSION / ASSESSMENT AND PLAN / ED COURSE  Clifford Farley is a 62 y.o. male who presents to the emergency department for evaluation and treatment of symptoms and exam most consistent with gout.  Uric acid is elevated, otherwise labs are not concerning for any acute findings.  He was given 1.2 mg of colchicine initially  with improvement of his pain.  A second dose of 0.6 mg was administered.  Patient will be instructed to follow-up with his primary care provider for refills of all of his medications, including his antihypertensives.  He is to return to the emergency department for symptoms of change or worsen if he is unable to schedule an appointment.  Medications  colchicine tablet 1.2 mg (1.2 mg Oral Given 05/25/17 1339)  colchicine tablet 0.6 mg (0.6 mg Oral Given 05/25/17 1441)    Pertinent labs & imaging results that were available during my care of the patient were reviewed by me and considered in my medical decision making (see chart for details).  _________________________________________   FINAL CLINICAL IMPRESSION(S) / ED DIAGNOSES  Final diagnoses:  Acute gout of multiple  sites, unspecified cause  Hypertension, unspecified type    ED Discharge Orders        Ordered    colchicine 0.6 MG tablet   Once     05/25/17 1453       If controlled substance prescribed during this visit, 12 month history viewed on the [facility] prior to issuing an initial prescription for Schedule II or III opiod.    Chinita Pester, FNP 05/25/17 1456    Governor Rooks, MD 05/25/17 [phone]

## 2017-11-16 DIAGNOSIS — M1712 Unilateral primary osteoarthritis, left knee: Secondary | ICD-10-CM | POA: Insufficient documentation

## 2017-12-02 ENCOUNTER — Other Ambulatory Visit: Payer: Self-pay

## 2017-12-02 ENCOUNTER — Emergency Department
Admission: EM | Admit: 2017-12-02 | Discharge: 2017-12-02 | Disposition: A | Discharge status: Home / Self Care | Payer: Medicare Other | Source: Home / Self Care | Attending: Emergency Medicine | Admitting: Emergency Medicine

## 2017-12-02 ENCOUNTER — Emergency Department: Payer: Medicare Other

## 2017-12-02 DIAGNOSIS — M109 Gout, unspecified: Secondary | ICD-10-CM | POA: Diagnosis not present

## 2017-12-02 DIAGNOSIS — I10 Essential (primary) hypertension: Secondary | ICD-10-CM

## 2017-12-02 DIAGNOSIS — Z86718 Personal history of other venous thrombosis and embolism: Secondary | ICD-10-CM | POA: Diagnosis not present

## 2017-12-02 DIAGNOSIS — Z79899 Other long term (current) drug therapy: Secondary | ICD-10-CM | POA: Diagnosis not present

## 2017-12-02 DIAGNOSIS — Z86711 Personal history of pulmonary embolism: Secondary | ICD-10-CM

## 2017-12-02 DIAGNOSIS — R0902 Hypoxemia: Secondary | ICD-10-CM | POA: Diagnosis present

## 2017-12-02 DIAGNOSIS — J40 Bronchitis, not specified as acute or chronic: Secondary | ICD-10-CM | POA: Diagnosis not present

## 2017-12-02 DIAGNOSIS — Z79891 Long term (current) use of opiate analgesic: Secondary | ICD-10-CM | POA: Diagnosis not present

## 2017-12-02 DIAGNOSIS — R42 Dizziness and giddiness: Secondary | ICD-10-CM

## 2017-12-02 DIAGNOSIS — Z7901 Long term (current) use of anticoagulants: Secondary | ICD-10-CM | POA: Insufficient documentation

## 2017-12-02 DIAGNOSIS — R791 Abnormal coagulation profile: Secondary | ICD-10-CM | POA: Diagnosis not present

## 2017-12-02 LAB — URINALYSIS, COMPLETE (UACMP) WITH MICROSCOPIC
Bacteria, UA: NONE SEEN
Bilirubin Urine: NEGATIVE
GLUCOSE, UA: NEGATIVE mg/dL
HGB URINE DIPSTICK: NEGATIVE
Ketones, ur: NEGATIVE mg/dL
LEUKOCYTES UA: NEGATIVE
Nitrite: NEGATIVE
PH: 6 (ref 5.0–8.0)
PROTEIN: NEGATIVE mg/dL
SPECIFIC GRAVITY, URINE: 1.009 (ref 1.005–1.030)

## 2017-12-02 LAB — COMPREHENSIVE METABOLIC PANEL
ALBUMIN: 4.5 g/dL (ref 3.5–5.0)
ALK PHOS: 60 U/L (ref 38–126)
ALT: 15 U/L (ref 0–44)
AST: 18 U/L (ref 15–41)
Anion gap: 7 (ref 5–15)
BILIRUBIN TOTAL: 0.6 mg/dL (ref 0.3–1.2)
BUN: 11 mg/dL (ref 8–23)
CALCIUM: 9.3 mg/dL (ref 8.9–10.3)
CO2: 26 mmol/L (ref 22–32)
CREATININE: 1.14 mg/dL (ref 0.61–1.24)
Chloride: 106 mmol/L (ref 98–111)
GFR calc Af Amer: >60 mL/min (ref >60)
GLUCOSE: 94 mg/dL (ref 70–99)
POTASSIUM: 3.9 mmol/L (ref 3.5–5.1)
Sodium: 139 mmol/L (ref 135–145)
TOTAL PROTEIN: 7.5 g/dL (ref 6.5–8.1)

## 2017-12-02 LAB — CBC WITH DIFFERENTIAL/PLATELET
BASOS ABS: 0 10*3/uL (ref 0–0.1)
BASOS PCT: 1 %
Eosinophils Absolute: 0.1 10*3/uL (ref 0–0.7)
Eosinophils Relative: 2 %
HEMATOCRIT: 41.6 % (ref 40.0–52.0)
HEMOGLOBIN: 13.8 g/dL (ref 13.0–18.0)
Lymphocytes Relative: 32 %
Lymphs Abs: 1 10*3/uL (ref 1.0–3.6)
MCH: 26.4 pg (ref 26.0–34.0)
MCHC: 33.1 g/dL (ref 32.0–36.0)
MCV: 79.9 fL — AB (ref 80.0–100.0)
MONO ABS: 0.3 10*3/uL (ref 0.2–1.0)
Monocytes Relative: 11 %
NEUTROS ABS: 1.8 10*3/uL (ref 1.4–6.5)
NEUTROS PCT: 54 %
Platelets: 187 10*3/uL (ref 150–440)
RBC: 5.21 MIL/uL (ref 4.40–5.90)
RDW: 17.6 % — AB (ref 11.5–14.5)
WBC: 3.2 10*3/uL — ABNORMAL LOW (ref 3.8–10.6)

## 2017-12-02 LAB — TROPONIN I

## 2017-12-02 LAB — PROTIME-INR
INR: 1.54
Prothrombin Time: 18.4 seconds — ABNORMAL HIGH (ref 11.4–15.2)

## 2017-12-02 MED ORDER — AMLODIPINE BESYLATE 5 MG PO TABS: 5.0000 mg | ORAL_TABLET | Freq: Every day | ORAL | 11 refills | Status: DC

## 2017-12-02 MED ORDER — HYDROCHLOROTHIAZIDE 25 MG PO TABS
25.0000 mg | ORAL_TABLET | Freq: Every day | ORAL | Status: DC
  Administered 2017-12-02: 25 mg via ORAL
  Filled 2017-12-02: qty 1

## 2017-12-02 MED ORDER — POLYETHYLENE GLYCOL 3350 17 G PO PACK: 17.0000 g | PACK | Freq: Every day | ORAL | 0 refills | Status: DC

## 2017-12-02 MED ORDER — SODIUM CHLORIDE 0.9 % IV SOLN
1000.0000 mL | Freq: Once | INTRAVENOUS | Status: AC
  Administered 2017-12-02: 1000 mL via INTRAVENOUS

## 2017-12-02 MED ORDER — HYDROCHLOROTHIAZIDE 25 MG PO TABS: 25.0000 mg | ORAL_TABLET | Freq: Every day | ORAL | 1 refills | Status: DC

## 2017-12-02 MED ORDER — AMLODIPINE BESYLATE 5 MG PO TABS
5.0000 mg | ORAL_TABLET | Freq: Once | ORAL | Status: AC
  Administered 2017-12-02: 5 mg via ORAL
  Filled 2017-12-02: qty 1

## 2017-12-02 NOTE — ED Provider Notes (Signed)
-----------------------------------------   3:23 PM on 12/02/2017 -----------------------------------------  I did not see or participate care of this patient.  However, I was informed that our new system of the prescription has sent the patient's prescription that from the prior physician for his home blood pressure medications to the wrong pharmacy.  They have asked me to replace this prescription, I have looked back at the notes, and reviewed the prior physicians plan, the patient was prescribed amlodipine.  I will therefore reprint Dr. Mayford Knife prescription for amlodipine and we will give directly to the patient.   Jeanmarie Plant, MD 12/02/17 [phone]

## 2017-12-02 NOTE — ED Notes (Signed)
Pt c/o dizziness with activity. Denies dizziness at this time while laying down.

## 2017-12-02 NOTE — ED Triage Notes (Signed)
Pt arrived via EMS c/o dizziness x 3 days. Hx of HTN and blood clots. States that he stopped taking his blood pressure medicine approx. 2-3 months ago due to the side effects.

## 2017-12-02 NOTE — ED Provider Notes (Addendum)
[facility] Emergency Department Provider Note       Time seen: ----------------------------------------- 1:05 PM on 12/02/2017 -----------------------------------------   I have reviewed the triage vital signs and the nursing notes.  HISTORY   Chief Complaint Dizziness    HPI Clifford Farley is a 62 y.o. male with a history of DVT, gout, hypertension who presents to the ED for dizziness with activity.  Patient describes lightheadedness that has been going on for the past 2 or 3 days.  He does have a history of hypertension and blood clots but is not taking his antihypertensive because it made him feel dizzy.  He still takes Coumadin for anticoagulation.  He denies any recent illness or other complaints.  Past Medical History:  Diagnosis Date  . Blood clot in vein 4 years ago   Location of clot unspecified in [facility] paper chart.    . Gout   . Hypertension     Patient Active Problem List   Diagnosis Date Noted  . Protime monitoring 03/08/2015  . Pulmonary embolism (HCC) 12/07/2014  . Hypertension 09/30/2012    Past Surgical History:  Procedure Laterality Date  . FRACTURE SURGERY     wrist  . KNEE ARTHROSCOPY Left 08/23/2016   Procedure: ARTHROSCOPY KNEE WITH PARTIAL MEDIAL MENISECTOMY;  Surgeon: Donato Heinz, MD;  Location: [facility] ORS;  Service: Orthopedics;  Laterality: Left;    Allergies Patient has no known allergies.  Social History Social History   Tobacco Use  . Smoking status: Never Smoker  . Smokeless tobacco: Never Used  Substance Use Topics  . Alcohol use: No  . Drug use: No   Review of Systems Constitutional: Negative for fever. Cardiovascular: Negative for chest pain. Respiratory: Negative for shortness of breath. Gastrointestinal: Negative for abdominal pain, vomiting and diarrhea. Musculoskeletal: Negative for back pain. Skin: Negative for rash. Neurological: Negative for headaches, focal weakness or numbness.  Positive  for dizziness  All systems negative/normal/unremarkable except as stated in the HPI  ____________________________________________   PHYSICAL EXAM:  VITAL SIGNS: ED Triage Vitals  Enc Vitals Group     BP 12/02/17 1211 (!) 187/128     Pulse Rate 12/02/17 1211 71     Resp 12/02/17 1211 14     Temp 12/02/17 1211 98 F (36.7 C)     Temp Source 12/02/17 1211 Oral     SpO2 12/02/17 1209 99 %     Weight 12/02/17 1212 235 lb (106.6 kg)     Height 12/02/17 1212 6\' 3"  (1.905 m)     Head Circumference --      Peak Flow --      Pain Score 12/02/17 1212 0     Pain Loc --      Pain Edu? --      Excl. in GC? --    Constitutional: Alert and oriented. Well appearing and in no distress. Eyes: Conjunctivae are normal. Normal extraocular movements. ENT   Head: Normocephalic and atraumatic.   Nose: No congestion/rhinnorhea.   Mouth/Throat: Mucous membranes are moist.   Neck: No stridor. Cardiovascular: Normal rate, regular rhythm. No murmurs, rubs, or gallops. Respiratory: Normal respiratory effort without tachypnea nor retractions. Breath sounds are clear and equal bilaterally. No wheezes/rales/rhonchi. Gastrointestinal: Soft and nontender. Normal bowel sounds Musculoskeletal: Nontender with normal range of motion in extremities. No lower extremity tenderness nor edema. Neurologic:  Normal speech and language. No gross focal neurologic deficits are appreciated.  Skin:  Skin is warm, dry and intact. No rash  noted. Psychiatric: Mood and affect are normal. Speech and behavior are normal.  ____________________________________________  EKG: Interpreted by me.  Sinus rhythm the rate of 76 bpm, PAC, normal QT, normal axis  ____________________________________________  ED COURSE:  As part of my medical decision making, I reviewed the following data within the electronic MEDICAL RECORD NUMBER History obtained from family if available, nursing notes, old chart and ekg, as well as notes from  prior ED visits. Patient presented for dizziness, we will assess with labs and imaging as indicated at this time.   Procedures ____________________________________________   LABS (pertinent positives/negatives)  Labs Reviewed  CBC WITH DIFFERENTIAL/PLATELET - Abnormal; Notable for the following components:      Result Value   WBC 3.2 (*)    MCV 79.9 (*)    RDW 17.6 (*)    All other components within normal limits  URINALYSIS, COMPLETE (UACMP) WITH MICROSCOPIC - Abnormal; Notable for the following components:   Color, Urine STRAW (*)    APPearance CLEAR (*)    All other components within normal limits  PROTIME-INR - Abnormal; Notable for the following components:   Prothrombin Time 18.4 (*)    All other components within normal limits  COMPREHENSIVE METABOLIC PANEL  TROPONIN I  CBG MONITORING, ED    RADIOLOGY Images were viewed by me  CT head IMPRESSION: No acute intracranial abnormalities identified.  ____________________________________________  DIFFERENTIAL DIAGNOSIS   Dehydration, electrolyte abnormality, hypertensive urgency, medication noncompliance, CVA, vertigo  FINAL ASSESSMENT AND PLAN  Dizziness, hypertension   Plan: The patient had presented for dizziness and was found to be markedly hypertensive. Patient's labs did not reveal any acute process. Patient's imaging did not reveal any acute process.  He is INR subtherapeutic so we will give additional dose of Coumadin.  He received doses of hydrochlorothiazide as well as amlodipine here for blood pressure control.  Current blood pressure 165/110 which is vastly improved from his prior.  I will also prescribe MiraLAX as he has had some intermittent constipation.  He is cleared for outpatient follow-up.   Ulice Dash, MD   Note: This note was generated in part or whole with voice recognition software. Voice recognition is usually quite accurate but there are transcription errors that can and very  often do occur. I apologize for any typographical errors that were not detected and corrected.     Emily Filbert, MD 12/02/17 1457    Emily Filbert, MD 12/02/17 [phone]

## 2017-12-05 ENCOUNTER — Inpatient Hospital Stay
Admission: EM | Admit: 2017-12-05 | Discharge: 2017-12-06 | DRG: 203 | Disposition: A | Discharge status: Home / Self Care | Payer: Medicare Other | Attending: Internal Medicine | Admitting: Internal Medicine

## 2017-12-05 ENCOUNTER — Emergency Department: Payer: Medicare Other

## 2017-12-05 ENCOUNTER — Encounter: Payer: Self-pay | Admitting: *Deleted

## 2017-12-05 ENCOUNTER — Inpatient Hospital Stay (HOSPITAL_COMMUNITY)
Admit: 2017-12-05 | Discharge: 2017-12-05 | Disposition: A | Discharge status: Home / Self Care | Payer: Medicare Other | Attending: Internal Medicine | Admitting: Internal Medicine

## 2017-12-05 ENCOUNTER — Other Ambulatory Visit: Payer: Self-pay

## 2017-12-05 DIAGNOSIS — R0602 Shortness of breath: Secondary | ICD-10-CM

## 2017-12-05 DIAGNOSIS — R0600 Dyspnea, unspecified: Secondary | ICD-10-CM

## 2017-12-05 DIAGNOSIS — R42 Dizziness and giddiness: Secondary | ICD-10-CM | POA: Diagnosis present

## 2017-12-05 DIAGNOSIS — R791 Abnormal coagulation profile: Secondary | ICD-10-CM | POA: Diagnosis not present

## 2017-12-05 DIAGNOSIS — J40 Bronchitis, not specified as acute or chronic: Principal | ICD-10-CM | POA: Diagnosis present

## 2017-12-05 DIAGNOSIS — Z79899 Other long term (current) drug therapy: Secondary | ICD-10-CM | POA: Diagnosis not present

## 2017-12-05 DIAGNOSIS — Z86718 Personal history of other venous thrombosis and embolism: Secondary | ICD-10-CM

## 2017-12-05 DIAGNOSIS — Z79891 Long term (current) use of opiate analgesic: Secondary | ICD-10-CM | POA: Diagnosis not present

## 2017-12-05 DIAGNOSIS — M109 Gout, unspecified: Secondary | ICD-10-CM | POA: Diagnosis present

## 2017-12-05 DIAGNOSIS — I10 Essential (primary) hypertension: Secondary | ICD-10-CM | POA: Diagnosis not present

## 2017-12-05 DIAGNOSIS — Z7901 Long term (current) use of anticoagulants: Secondary | ICD-10-CM | POA: Diagnosis not present

## 2017-12-05 DIAGNOSIS — R0902 Hypoxemia: Secondary | ICD-10-CM | POA: Diagnosis present

## 2017-12-05 LAB — CBC WITH DIFFERENTIAL/PLATELET
BASOS ABS: 0 10*3/uL (ref 0–0.1)
BASOS PCT: 1 %
Eosinophils Absolute: 0.1 10*3/uL (ref 0–0.7)
Eosinophils Relative: 2 %
HEMATOCRIT: 42.8 % (ref 40.0–52.0)
HEMOGLOBIN: 14.3 g/dL (ref 13.0–18.0)
LYMPHS PCT: 41 %
Lymphs Abs: 1.5 10*3/uL (ref 1.0–3.6)
MCH: 26.4 pg (ref 26.0–34.0)
MCHC: 33.4 g/dL (ref 32.0–36.0)
MCV: 79.1 fL — ABNORMAL LOW (ref 80.0–100.0)
Monocytes Absolute: 0.5 10*3/uL (ref 0.2–1.0)
Monocytes Relative: 13 %
Neutro Abs: 1.6 10*3/uL (ref 1.4–6.5)
Neutrophils Relative %: 43 %
Platelets: 216 10*3/uL (ref 150–440)
RBC: 5.41 MIL/uL (ref 4.40–5.90)
RDW: 17.7 % — AB (ref 11.5–14.5)
WBC: 3.7 10*3/uL — ABNORMAL LOW (ref 3.8–10.6)

## 2017-12-05 LAB — CBC
HCT: 41.8 % (ref 40.0–52.0)
Hemoglobin: 14 g/dL (ref 13.0–18.0)
MCH: 26.5 pg (ref 26.0–34.0)
MCHC: 33.4 g/dL (ref 32.0–36.0)
MCV: 79.1 fL — ABNORMAL LOW (ref 80.0–100.0)
Platelets: 192 10*3/uL (ref 150–440)
RBC: 5.28 MIL/uL (ref 4.40–5.90)
RDW: 17.8 % — AB (ref 11.5–14.5)
WBC: 4 10*3/uL (ref 3.8–10.6)

## 2017-12-05 LAB — COMPREHENSIVE METABOLIC PANEL
ALBUMIN: 4.6 g/dL (ref 3.5–5.0)
ALK PHOS: 64 U/L (ref 38–126)
ALT: 15 U/L (ref 0–44)
ANION GAP: 9 (ref 5–15)
AST: 19 U/L (ref 15–41)
BILIRUBIN TOTAL: 0.8 mg/dL (ref 0.3–1.2)
BUN: 14 mg/dL (ref 8–23)
CALCIUM: 9.3 mg/dL (ref 8.9–10.3)
CO2: 24 mmol/L (ref 22–32)
Chloride: 105 mmol/L (ref 98–111)
Creatinine, Ser: 1.19 mg/dL (ref 0.61–1.24)
GFR calc Af Amer: >60 mL/min (ref >60)
GFR calc non Af Amer: >60 mL/min (ref >60)
GLUCOSE: 114 mg/dL — AB (ref 70–99)
Potassium: 3.7 mmol/L (ref 3.5–5.1)
Sodium: 138 mmol/L (ref 135–145)
TOTAL PROTEIN: 7.8 g/dL (ref 6.5–8.1)

## 2017-12-05 LAB — PROTIME-INR
INR: 1.45
Prothrombin Time: 17.5 seconds — ABNORMAL HIGH (ref 11.4–15.2)

## 2017-12-05 LAB — BASIC METABOLIC PANEL
ANION GAP: 11 (ref 5–15)
BUN: 15 mg/dL (ref 8–23)
CALCIUM: 9 mg/dL (ref 8.9–10.3)
CO2: 23 mmol/L (ref 22–32)
CREATININE: 1.27 mg/dL — AB (ref 0.61–1.24)
Chloride: 103 mmol/L (ref 98–111)
GFR calc Af Amer: >60 mL/min (ref >60)
GFR calc non Af Amer: 59 mL/min — ABNORMAL LOW (ref >60)
GLUCOSE: 141 mg/dL — AB (ref 70–99)
Potassium: 3.6 mmol/L (ref 3.5–5.1)
Sodium: 137 mmol/L (ref 135–145)

## 2017-12-05 LAB — ECHOCARDIOGRAM COMPLETE
HEIGHTINCHES: 75 in
WEIGHTICAEL: 3759.99 [oz_av]

## 2017-12-05 LAB — BRAIN NATRIURETIC PEPTIDE: B Natriuretic Peptide: 8 pg/mL (ref 0.0–100.0)

## 2017-12-05 LAB — TROPONIN I: Troponin I: <0.03 ng/mL (ref <0.03)

## 2017-12-05 MED ORDER — ONDANSETRON HCL 4 MG PO TABS: 4.0000 mg | ORAL_TABLET | Freq: Four times a day (QID) | ORAL | Status: DC | PRN

## 2017-12-05 MED ORDER — PROCHLORPERAZINE EDISYLATE 10 MG/2ML IJ SOLN
5.0000 mg | Freq: Once | INTRAMUSCULAR | Status: AC
  Administered 2017-12-05: 5 mg via INTRAVENOUS
  Filled 2017-12-05: qty 2

## 2017-12-05 MED ORDER — IPRATROPIUM-ALBUTEROL 0.5-2.5 (3) MG/3ML IN SOLN
3.0000 mL | Freq: Once | RESPIRATORY_TRACT | Status: AC
  Administered 2017-12-05: 3 mL via RESPIRATORY_TRACT
  Filled 2017-12-05: qty 3

## 2017-12-05 MED ORDER — ACETAMINOPHEN 325 MG PO TABS: 650.0000 mg | ORAL_TABLET | Freq: Four times a day (QID) | ORAL | Status: DC | PRN

## 2017-12-05 MED ORDER — IPRATROPIUM-ALBUTEROL 0.5-2.5 (3) MG/3ML IN SOLN: 3.0000 mL | Freq: Once | RESPIRATORY_TRACT | Status: DC

## 2017-12-05 MED ORDER — CARVEDILOL 3.125 MG PO TABS
3.1250 mg | ORAL_TABLET | Freq: Two times a day (BID) | ORAL | Status: DC
  Administered 2017-12-05 – 2017-12-06 (×3): 3.125 mg via ORAL
  Filled 2017-12-05 (×3): qty 1

## 2017-12-05 MED ORDER — IPRATROPIUM-ALBUTEROL 0.5-2.5 (3) MG/3ML IN SOLN: 3.0000 mL | RESPIRATORY_TRACT | Status: DC | PRN

## 2017-12-05 MED ORDER — ALBUTEROL SULFATE (2.5 MG/3ML) 0.083% IN NEBU
5.0000 mg | INHALATION_SOLUTION | Freq: Once | RESPIRATORY_TRACT | Status: AC
  Administered 2017-12-05: 5 mg via RESPIRATORY_TRACT
  Filled 2017-12-05: qty 6

## 2017-12-05 MED ORDER — PERFLUTREN LIPID MICROSPHERE
1.0000 mL | INTRAVENOUS | Status: AC | PRN
  Administered 2017-12-05: 3 mL via INTRAVENOUS
  Filled 2017-12-05: qty 10

## 2017-12-05 MED ORDER — ENOXAPARIN SODIUM 120 MG/0.8ML ~~LOC~~ SOLN
1.0000 mg/kg | Freq: Once | SUBCUTANEOUS | Status: AC
  Administered 2017-12-05: 105 mg via SUBCUTANEOUS
  Filled 2017-12-05: qty 0.8

## 2017-12-05 MED ORDER — DEXAMETHASONE SODIUM PHOSPHATE 10 MG/ML IJ SOLN
10.0000 mg | Freq: Once | INTRAMUSCULAR | Status: AC
  Administered 2017-12-05: 10 mg via INTRAVENOUS
  Filled 2017-12-05: qty 1

## 2017-12-05 MED ORDER — AZITHROMYCIN 500 MG PO TABS
500.0000 mg | ORAL_TABLET | Freq: Once | ORAL | Status: AC
  Administered 2017-12-05: 500 mg via ORAL
  Filled 2017-12-05: qty 1

## 2017-12-05 MED ORDER — ONDANSETRON HCL 4 MG/2ML IJ SOLN: 4.0000 mg | Freq: Four times a day (QID) | INTRAMUSCULAR | Status: DC | PRN

## 2017-12-05 MED ORDER — WARFARIN SODIUM 4 MG PO TABS
4.0000 mg | ORAL_TABLET | Freq: Once | ORAL | Status: AC
  Administered 2017-12-05: 4 mg via ORAL
  Filled 2017-12-05: qty 1

## 2017-12-05 MED ORDER — ENOXAPARIN SODIUM 100 MG/ML ~~LOC~~ SOLN
100.0000 mg | Freq: Two times a day (BID) | SUBCUTANEOUS | Status: DC
  Administered 2017-12-05 – 2017-12-06 (×2): 100 mg via SUBCUTANEOUS
  Filled 2017-12-05 (×3): qty 1

## 2017-12-05 MED ORDER — SUCRALFATE 1 G PO TABS
1.0000 g | ORAL_TABLET | Freq: Once | ORAL | Status: AC
  Administered 2017-12-05: 1 g via ORAL
  Filled 2017-12-05: qty 1

## 2017-12-05 MED ORDER — ACETAMINOPHEN 650 MG RE SUPP: 650.0000 mg | Freq: Four times a day (QID) | RECTAL | Status: DC | PRN

## 2017-12-05 MED ORDER — SODIUM CHLORIDE 0.9 % IV SOLN
1.0000 g | INTRAVENOUS | Status: DC
  Administered 2017-12-05 – 2017-12-06 (×2): 1 g via INTRAVENOUS
  Filled 2017-12-05 (×2): qty 1

## 2017-12-05 MED ORDER — WARFARIN - PHARMACIST DOSING INPATIENT: Freq: Every day | Status: DC

## 2017-12-05 MED ORDER — GI COCKTAIL ~~LOC~~
30.0000 mL | Freq: Once | ORAL | Status: AC
  Administered 2017-12-05: 30 mL via ORAL
  Filled 2017-12-05: qty 30

## 2017-12-05 MED ORDER — AZITHROMYCIN 500 MG PO TABS
500.0000 mg | ORAL_TABLET | Freq: Every day | ORAL | Status: DC
  Administered 2017-12-06: 500 mg via ORAL
  Filled 2017-12-05: qty 1

## 2017-12-05 MED ORDER — WARFARIN SODIUM 3 MG PO TABS: 3.0000 mg | ORAL_TABLET | Freq: Every day | ORAL | Status: DC

## 2017-12-05 MED ORDER — DEXAMETHASONE SODIUM PHOSPHATE 10 MG/ML IJ SOLN: 10.0000 mg | Freq: Once | INTRAMUSCULAR | Status: DC

## 2017-12-05 MED ORDER — IOPAMIDOL (ISOVUE-370) INJECTION 76%
75.0000 mL | Freq: Once | INTRAVENOUS | Status: AC | PRN
  Administered 2017-12-05: 75 mL via INTRAVENOUS

## 2017-12-05 MED ORDER — OXYCODONE-ACETAMINOPHEN 5-325 MG PO TABS: 1.0000 | ORAL_TABLET | Freq: Four times a day (QID) | ORAL | Status: DC | PRN

## 2017-12-05 NOTE — Progress Notes (Signed)
   12/05/17 1245  Clinical Encounter Type  Visited With Patient  Visit Type Initial;Spiritual support  Referral From Nurse  Consult/Referral To Chaplain  Spiritual Encounters  Spiritual Needs Emotional;Other (Comment)   CH received an OR to speak with Clifford Farley about an AD. The patient denied wanting information on completing an AD. I spoke at length with the patient about his love of sports as he reminisced about his days playing high school sports. I will follow up again with the patient as needed.

## 2017-12-05 NOTE — Progress Notes (Signed)
ANTICOAGULATION CONSULT NOTE - Initial Consult  Pharmacy Consult for warfarin dosing Indication: VTE prophylaxis  No Known Allergies  Patient Measurements: Height: 6\' 3"  (190.5 cm) Weight: 235 lb (106.6 kg) IBW/kg (Calculated) : 84.5 Heparin Dosing Weight: n/a  Vital Signs: Temp: 98.2 F (36.8 C) (09/06 0038) BP: 138/82 (09/06 0200) Pulse Rate: 102 (09/06 0200)  Labs: Recent Labs    12/02/17 1303 12/05/17 0045  HGB 13.8 14.3  HCT 41.6 42.8  PLT 187 216  LABPROT 18.4* 17.5*  INR 1.54 1.45  CREATININE 1.14 1.19  TROPONINI <0.03 <0.03    Estimated Creatinine Clearance: 84.9 mL/min (by C-G formula based on SCr of 1.19 mg/dL).   Medical History: Past Medical History:  Diagnosis Date  . Blood clot in vein 4 years ago   Location of clot unspecified in [facility] paper chart.    . Gout   . Hypertension     Medications:  Home dose of warfarin 3 mg daily.  Assessment: INR subtherapeutic on admission  Goal of Therapy:  INR 2-3    Plan:  Resume home dose as above. INR 9/7 AM.  Tiffay Pinette S 12/05/2017,3:38 AM

## 2017-12-05 NOTE — Progress Notes (Signed)
*  PRELIMINARY RESULTS* Echocardiogram 2D Echocardiogram has been performed.  Joanette Gula Arraya Buck 12/05/2017, 3:24 PM

## 2017-12-05 NOTE — ED Provider Notes (Signed)
[facility] Emergency Department Provider Note  ____________________________________________   First MD Initiated Contact with Patient 12/05/17 0036     (approximate)  I have reviewed the triage vital signs and the nursing notes.   HISTORY  Chief Complaint Shortness of Breath   HPI Clifford Farley is a 62 y.o. male who presents to the emergency department  via EMS with exertional shortness of breath worsening over the past for 5 days.  Symptoms are worse when exerting himself and improved with rest.  He said that when he is walking he feels nearly completely exhausted and can only walk across the room without having to sit down.  He was seen in our emergency department 2 days ago where he was noted to have new onset hypertension and was initiated on amlodipine.  His symptoms have not improved in that time.  He does have a past medical history of deep vein thrombosis for which he takes warfarin.  His last INR check was about 3 weeks ago.  He denies recent surgery travel or immobilization.  He denies any pulmonary history.  He has never smoked.  No cough.  No hemoptysis.  No fevers or chills.  His symptoms are now severe.   Past Medical History:  Diagnosis Date  . Blood clot in vein 4 years ago   Location of clot unspecified in [facility] paper chart.    . Gout   . Hypertension     Patient Active Problem List   Diagnosis Date Noted  . Hypoxia 12/05/2017  . History of DVT (deep vein thrombosis) 12/05/2017  . Protime monitoring 03/08/2015  . Pulmonary embolism (HCC) 12/07/2014  . Hypertension 09/30/2012    Past Surgical History:  Procedure Laterality Date  . FRACTURE SURGERY     wrist  . KNEE ARTHROSCOPY Left 08/23/2016   Procedure: ARTHROSCOPY KNEE WITH PARTIAL MEDIAL MENISECTOMY;  Surgeon: Donato Heinz, MD;  Location: [facility] ORS;  Service: Orthopedics;  Laterality: Left;    Prior to Admission medications   Medication Sig Start Date End Date Taking?  Authorizing Provider  amLODipine (NORVASC) 5 MG tablet Take 1 tablet (5 mg total) by mouth daily. 12/02/17 12/02/18 Yes McShane, Rudy Jew, MD  hydrochlorothiazide (HYDRODIURIL) 25 MG tablet Take 1 tablet (25 mg total) by mouth daily. 12/02/17  Yes Emily Filbert, MD  polyethylene glycol (MIRALAX / GLYCOLAX) packet Take 17 g by mouth daily. 12/02/17  Yes Emily Filbert, MD  warfarin (COUMADIN) 1 MG tablet Take 3 mg by mouth daily.  07/11/16  Yes [provider]    Allergies Patient has no known allergies.  History reviewed. No pertinent family history.  Social History Social History   Tobacco Use  . Smoking status: Never Smoker  . Smokeless tobacco: Never Used  Substance Use Topics  . Alcohol use: No  . Drug use: No    Review of Systems Constitutional: No fever/chills Eyes: No visual changes. ENT: No sore throat. Cardiovascular: Denies chest pain. Respiratory: Positive for shortness of breath. Gastrointestinal: No abdominal pain.  No nausea, no vomiting.  No diarrhea.  No constipation. Genitourinary: Negative for dysuria. Musculoskeletal: Negative for back pain. Skin: Negative for rash. Neurological: Negative for headaches, focal weakness or numbness.   ____________________________________________   PHYSICAL EXAM:  VITAL SIGNS: ED Triage Vitals [12/05/17 0035]  Enc Vitals Group     BP      Pulse      Resp      Temp  Temp src      SpO2 95 %     Weight      Height      Head Circumference      Peak Flow      Pain Score      Pain Loc      Pain Edu?      Excl. in GC?     Constitutional: Alert and oriented x4 obviously short of breath speaking in short sentences Eyes: PERRL EOMI. Head: Atraumatic. Nose: No congestion/rhinnorhea. Mouth/Throat: No trismus Neck: No stridor.  Able to lie completely flat with no JVD Cardiovascular: Normal rate, regular rhythm. Grossly normal heart sounds.  Good peripheral circulation. Respiratory: Increased  respiratory effort.  No retractions. Lungs CTAB and moving good air Gastrointestinal: Soft nontender Musculoskeletal: No lower extremity edema legs equal in size Neurologic:  Normal speech and language. No gross focal neurologic deficits are appreciated. Skin:  Skin is warm, dry and intact. No rash noted. Psychiatric: Mood and affect are normal. Speech and behavior are normal.    ____________________________________________   DIFFERENTIAL includes but not limited to  Pulmonary embolism, congestive heart failure, lung cancer, pneumonia, pneumothorax ____________________________________________   LABS (all labs ordered are listed, but only abnormal results are displayed)  Labs Reviewed  COMPREHENSIVE METABOLIC PANEL - Abnormal; Notable for the following components:      Result Value   Glucose, Bld 114 (*)    All other components within normal limits  CBC WITH DIFFERENTIAL/PLATELET - Abnormal; Notable for the following components:   WBC 3.7 (*)    MCV 79.1 (*)    RDW 17.7 (*)    All other components within normal limits  PROTIME-INR - Abnormal; Notable for the following components:   Prothrombin Time 17.5 (*)    All other components within normal limits  BRAIN NATRIURETIC PEPTIDE  TROPONIN I  HIV ANTIBODY (ROUTINE TESTING)  BASIC METABOLIC PANEL  CBC    Lab work reviewed by me with slightly low white count which on chart review is chronic for the patient __________________________________________  EKG   ____________________________________________  RADIOLOGY  CT angiogram of the chest reviewed by me with no clot but is suggestive of reactive airway disease ____________________________________________   PROCEDURES  Procedure(s) performed: no  .Critical Care Performed by: Merrily Brittle, MD Authorized by: Merrily Brittle, MD   Critical care provider statement:    Critical care time (minutes):  30   Critical care time was exclusive of:  Separately billable  procedures and treating other patients   Critical care was necessary to treat or prevent imminent or life-threatening deterioration of the following conditions:  Respiratory failure   Critical care was time spent personally by me on the following activities:  Development of treatment plan with patient or surrogate, discussions with consultants, evaluation of patient's response to treatment, examination of patient, obtaining history from patient or surrogate, ordering and performing treatments and interventions, ordering and review of laboratory studies, ordering and review of radiographic studies, pulse oximetry, re-evaluation of patient's condition and review of old charts    Critical Care performed: Yes  ____________________________________________   INITIAL IMPRESSION / ASSESSMENT AND PLAN / ED COURSE  Pertinent labs & imaging results that were available during my care of the patient were reviewed by me and considered in my medical decision making (see chart for details).   As part of my medical decision making, I reviewed the following data within the electronic MEDICAL RECORD NUMBER History obtained from family if available, nursing  notes, old chart and ekg, as well as notes from prior ED visits.      ----------------------------------------- 12:44 AM on 12/05/2017 -----------------------------------------  The patient is hypoxic to 95% on room air with known history of DVTs and no pulmonary disease and clear lungs.  I am concerned for pulmonary embolism so we will go ahead and anticoagulate him with Lovenox now as well as get a CT angiogram of his chest. ____________________________________________  Fortunately the patient CT is negative for clot.  It is concerning for reactive airway disease we will give a trial of a breathing treatment and Decadron and reevaluate. ----------------------------------------- 2:28 AM on 12/05/2017 -----------------------------------------  I ambulated  the patient and he can only make it about 30 feet without becoming completely out of breath and exhausted.  He was subsequently saturating 89%.  Again his lungs are largely clear.  He has no evidence of heart failure.  At this point I do not have a clear reason why the patient is hypoxic and short of breath however given his requirement for oxygen (comes up to the mid 90s on 2 L nasal cannula) he requires inpatient admission for more complete evaluation and treatment.  I will cover him with a azithromycin in the meantime.  I discussed with the hospitalist Dr. Anne Hahn who has graciously agreed to admit the patient to his service.  FINAL CLINICAL IMPRESSION(S) / ED DIAGNOSES  Final diagnoses:  Shortness of breath  Hypoxia      NEW MEDICATIONS STARTED DURING THIS VISIT:  Current Discharge Medication List       Note:  This document was prepared using Dragon voice recognition software and may include unintentional dictation errors.     Merrily Brittle, MD 12/05/17 [phone]

## 2017-12-05 NOTE — ED Notes (Signed)
Patient transported to CT. No in room to admin neb.

## 2017-12-05 NOTE — Progress Notes (Signed)
Patient briefly seen and examined.  Patient here with hypoxia.  CT chest was negative for PE.  Await echocardiogram and pulmonary consultation.  Patient on Coumadin for history of DVTs and pharmacy is dosing.

## 2017-12-05 NOTE — Progress Notes (Signed)
Family Meeting Note  Advance Directive:no  Today a meeting took place with the Patient. The following clinical team members were present during this meeting:MD  The following were discussed:Patient's diagnosis hypoxia: , Patient's progosis: > 12 months and Goals for treatment: Full Code  Additional follow-up to be provided: chaplain consult create AD  Time spent during discussion:16 minutes  Nasra Counce, MD

## 2017-12-05 NOTE — ED Notes (Signed)
Pt ambulated for a short distance with assistance off with no oxygen in place. Pt once returned to room had an O2 level of 89%.

## 2017-12-05 NOTE — Progress Notes (Addendum)
ANTICOAGULATION CONSULT NOTE - Initial Consult  Pharmacy Consult for warfarin dosing Indication: VTE prophylaxis  No Known Allergies  Patient Measurements: Height: 6\' 3"  (190.5 cm) Weight: 235 lb (106.6 kg) IBW/kg (Calculated) : 84.5 Heparin Dosing Weight: n/a  Vital Signs: Temp: 97.8 F (36.6 C) (09/06 0534) Temp Source: Oral (09/06 0534) BP: 133/93 (09/06 0534) Pulse Rate: 82 (09/06 0534)  Labs: Recent Labs    12/02/17 1303 12/05/17 0045 12/05/17 0659  HGB 13.8 14.3 14.0  HCT 41.6 42.8 41.8  PLT 187 216 192  LABPROT 18.4* 17.5*  --   INR 1.54 1.45  --   CREATININE 1.14 1.19 1.27*  TROPONINI <0.03 <0.03  --     Estimated Creatinine Clearance: 79.6 mL/min (A) (by C-G formula based on SCr of 1.27 mg/dL (H)).   Medical History: Past Medical History:  Diagnosis Date  . Blood clot in vein 4 years ago   Location of clot unspecified in [facility] chart.    . Gout   . Hypertension     Medications:  Home dose of warfarin 3 mg daily.  Assessment: Pharmacy consulted for warfarin dosing and monitoring in 62 yo male with PMH of DVT and PE. INR subtherapeutic on admission. Patient received enoxaparin 1mg /kg x 1 dose in ED. Patient also started on antibiotics.   Goal of Therapy:  INR 2-3    Plan:   Will order warfarin 4mg  PO x 1 dose tonight. Will check INRs daily while on antibiotics and while INR is not therapeutic. Enoxaparin 1mg /kg (100mg ) every 12 hours ordered while INR subtherapeutic. Will need to discontinue enoxaparin when INR is therapeutic.  Pharmacy will continue to follow labs and adjust dose as needed .  Gardner Candle, PharmD, BCPS Clinical Pharmacist 12/05/2017 9:44 AM

## 2017-12-05 NOTE — ED Triage Notes (Signed)
Pt presents w/ c/o shortness of breath. Pt recently started on HCTZ for HTN. Pt denies CP at this time. Pt c/o weakness. Pt states dry heaves and nausea. Pt in no acute respiratory distress at this time, ambulatory, no objective physical distress w/ regard to pain.

## 2017-12-05 NOTE — ED Notes (Signed)
2A unable to take pt at this time

## 2017-12-05 NOTE — H&P (Signed)
[facility] [place] at [facility]   PATIENT NAME: Clifford Farley    MR#:  [id number]  DATE OF BIRTH:  April 29, 1955  DATE OF ADMISSION:  12/05/2017  PRIMARY CARE PHYSICIAN: Lauro Regulus, MD   REQUESTING/REFERRING PHYSICIAN: Lamont Snowball, MD  CHIEF COMPLAINT:   Chief Complaint  Patient presents with  . Shortness of Breath    HISTORY OF PRESENT ILLNESS:  Basir Vale  is a 62 y.o. male who presents with chief complaint as above.  Patient had an episode of acute dyspnea tonight when he returned to his bed from using the bathroom.  He states that he has had general fatigue recently.  His dyspnea tonight was bad enough for him to call EMS, who found to be hypoxic.  Initial work-up here in the ED including CTA chest was largely within normal limits.  However, the patient was persistently hypoxic here, especially when he would get up to walk around.  Patient denies any recent fever, cough, edema or history of lung disease.  Hospitalist were called for admission and further evaluation  PAST MEDICAL HISTORY:   Past Medical History:  Diagnosis Date  . Blood clot in vein 4 years ago   Location of clot unspecified in [facility] paper chart.    . Gout   . Hypertension      PAST SURGICAL HISTORY:   Past Surgical History:  Procedure Laterality Date  . FRACTURE SURGERY     wrist  . KNEE ARTHROSCOPY Left 08/23/2016   Procedure: ARTHROSCOPY KNEE WITH PARTIAL MEDIAL MENISECTOMY;  Surgeon: Donato Heinz, MD;  Location: [facility] ORS;  Service: Orthopedics;  Laterality: Left;     SOCIAL HISTORY:   Social History   Tobacco Use  . Smoking status: Never Smoker  . Smokeless tobacco: Never Used  Substance Use Topics  . Alcohol use: No     FAMILY HISTORY:  Family history reviewed and is non-contributory   DRUG ALLERGIES:  No Known Allergies  MEDICATIONS AT HOME:   Prior to Admission medications   Medication Sig Start Date End Date Taking? Authorizing  Provider  amLODipine (NORVASC) 5 MG tablet Take 1 tablet (5 mg total) by mouth daily. 12/02/17 12/02/18  Jeanmarie Plant, MD  carvedilol (COREG) 3.125 MG tablet Take 3.125 mg by mouth 2 (two) times daily. 03/14/16 03/14/17  [provider]  colchicine 0.6 MG tablet Take 2 tablets (1.2 mg total) by mouth once for 1 dose. If pain persists after 1 hour, take 1 additional pill. No more than 3 pills in 24 hours. 05/25/17 05/25/17  Triplett, Rulon Eisenmenger B, FNP  hydrochlorothiazide (HYDRODIURIL) 25 MG tablet Take 1 tablet (25 mg total) by mouth daily. 12/02/17   Emily Filbert, MD  HYDROcodone-acetaminophen (NORCO) 5-325 MG tablet Take 1-2 tablets by mouth every 4 (four) hours as needed for moderate pain. 08/23/16   Hooten, Illene Labrador, MD  ibuprofen (ADVIL,MOTRIN) 800 MG tablet Take 1 tablet (800 mg total) by mouth as needed. Patient not taking: Reported on 08/16/2016 02/21/16   Virl Axe, MD  ondansetron (ZOFRAN) 4 MG tablet Take 1 tablet (4 mg total) by mouth daily as needed. 12/24/16   Schaevitz, Myra Rude, MD  oxyCODONE-acetaminophen (ROXICET) 5-325 MG tablet Take 1-2 tablets by mouth every 6 (six) hours as needed. 12/24/16   Schaevitz, Myra Rude, MD  polyethylene glycol [facility] / Ethelene Hal) packet Take 17 g by mouth daily. 12/02/17   Emily Filbert, MD  verapamil (CALAN-SR) 180 MG CR tablet Take  1 tablet (180 mg total) by mouth daily. Patient not taking: Reported on 08/16/2016 01/24/16   Virl Axe, MD  warfarin (COUMADIN) 1 MG tablet Take 3 mg by mouth daily. Stopped for surgery 07/11/16   [provider]    REVIEW OF SYSTEMS:  Review of Systems  Constitutional: Negative for chills, fever, malaise/fatigue and weight loss.  HENT: Negative for ear pain, hearing loss and tinnitus.   Eyes: Negative for blurred vision, double vision, pain and redness.  Respiratory: Positive for shortness of breath. Negative for cough and hemoptysis.   Cardiovascular: Negative for chest pain,  palpitations, orthopnea and leg swelling.  Gastrointestinal: Negative for abdominal pain, constipation, diarrhea, nausea and vomiting.  Genitourinary: Negative for dysuria, frequency and hematuria.  Musculoskeletal: Negative for back pain, joint pain and neck pain.  Skin:       No acne, rash, or lesions  Neurological: Negative for dizziness, tremors, focal weakness and weakness.  Endo/Heme/Allergies: Negative for polydipsia. Does not bruise/bleed easily.  Psychiatric/Behavioral: Negative for depression. The patient is not nervous/anxious and does not have insomnia.      VITAL SIGNS:   Vitals:   12/05/17 0041 12/05/17 0130 12/05/17 0145 12/05/17 0200  BP:  (!) 163/98  138/82  Pulse:  100 99 (!) 102  Temp:      SpO2:  97% 94% 94%  Weight: 106.6 kg     Height: 6\' 3"  (1.905 m)      Wt Readings from Last 3 Encounters:  12/05/17 106.6 kg  12/02/17 106.6 kg  05/25/17 111.1 kg    PHYSICAL EXAMINATION:  Physical Exam  LABORATORY PANEL:   CBC Recent Labs  Lab 12/05/17 0045  WBC 3.7*  HGB 14.3  HCT 42.8  PLT 216   ------------------------------------------------------------------------------------------------------------------  Chemistries  Recent Labs  Lab 12/05/17 0045  NA 138  K 3.7  CL 105  CO2 24  GLUCOSE 114*  BUN 14  CREATININE 1.19  CALCIUM 9.3  AST 19  ALT 15  ALKPHOS 64  BILITOT 0.8   ------------------------------------------------------------------------------------------------------------------  Cardiac Enzymes Recent Labs  Lab 12/05/17 0045  TROPONINI <0.03   ------------------------------------------------------------------------------------------------------------------  RADIOLOGY:  Ct Angio Chest Pe W/cm &/or Wo Cm  Result Date: 12/05/2017 CLINICAL DATA:  Dyspnea EXAM: CT ANGIOGRAPHY CHEST WITH CONTRAST TECHNIQUE: Multidetector CT imaging of the chest was performed using the standard protocol during bolus administration of intravenous  contrast. Multiplanar CT image reconstructions and MIPs were obtained to evaluate the vascular anatomy. CONTRAST:  75mL ISOVUE-370 IOPAMIDOL (ISOVUE-370) INJECTION 76% COMPARISON:  10/05/2013 CXR, chest CT 04/27/2012 FINDINGS: Cardiovascular: The study is of quality for the evaluation of pulmonary embolism. There are no filling defects in the central, lobar, segmental or subsegmental pulmonary artery branches to suggest acute pulmonary embolism. Great vessels are normal in course and caliber. Normal heart size. No significant pericardial fluid/thickening. Mediastinum/Nodes: No discrete thyroid nodules. Unremarkable esophagus. No pathologically enlarged axillary, mediastinal or hilar lymph nodes. Lungs/Pleura: Mild eventration of the left hemidiaphragm. Mild peribronchial thickening bilaterally. No alveolar consolidation, effusion or pneumothorax. Dependent bibasilar atelectasis. Upper abdomen: Mild steatosis of the liver suggested. No acute abnormality in the upper abdomen. Musculoskeletal: No aggressive appearing focal osseous lesions. No acute fracture. Review of the MIP images confirms the above findings. IMPRESSION: 1. No acute pulmonary embolus. 2. Nonaneurysmal thoracic aorta without dissection. 3. Mild peribronchial thickening without pulmonary consolidation query small airway inflammation/bronchitic change. Electronically Signed   By: Tollie Eth M.D.   On: 12/05/2017 01:13    EKG:  Orders placed or performed during the hospital encounter of 12/05/17  . ED EKG  . ED EKG  . ED EKG  . ED EKG    IMPRESSION AND PLAN:  Principal Problem:   Hypoxia -unclear etiology, patient's oxygen level improves when he is resting, will use PRN supplemental O2 via nasal cannula, admit for echocardiogram, and pulmonology consult Active Problems:   Hypertension -continue home meds   History of DVT (deep vein thrombosis) -continue anticoagulation with warfarin  Chart review performed and case discussed with ED  provider. Labs, imaging and/or ECG reviewed by provider and discussed with patient/family. Management plans discussed with the patient and/or family.  DVT PROPHYLAXIS: Systemic anticoagulation  GI PROPHYLAXIS:  None  ADMISSION STATUS: Inpatient     CODE STATUS: Full  TOTAL TIME TAKING CARE OF THIS PATIENT: 45 minutes.   Jacinda Kanady FIELDING 12/05/2017, 3:26 AM  [facility]  Office  [phone]  CC: Primary care physician; Lauro Regulus, MD  Note:  This document was prepared using Dragon voice recognition software and may include unintentional dictation errors.

## 2017-12-06 DIAGNOSIS — J40 Bronchitis, not specified as acute or chronic: Secondary | ICD-10-CM | POA: Diagnosis not present

## 2017-12-06 DIAGNOSIS — R0902 Hypoxemia: Secondary | ICD-10-CM | POA: Diagnosis not present

## 2017-12-06 LAB — CBC
HEMATOCRIT: 40.2 % (ref 40.0–52.0)
HEMOGLOBIN: 13.4 g/dL (ref 13.0–18.0)
MCH: 26.4 pg (ref 26.0–34.0)
MCHC: 33.3 g/dL (ref 32.0–36.0)
MCV: 79.3 fL — ABNORMAL LOW (ref 80.0–100.0)
PLATELETS: 204 10*3/uL (ref 150–440)
RBC: 5.07 MIL/uL (ref 4.40–5.90)
RDW: 17.8 % — AB (ref 11.5–14.5)
WBC: 6.2 10*3/uL (ref 3.8–10.6)

## 2017-12-06 LAB — PROTIME-INR
INR: 1.72
PROTHROMBIN TIME: 20 s — AB (ref 11.4–15.2)

## 2017-12-06 LAB — HIV ANTIBODY (ROUTINE TESTING W REFLEX): HIV Screen 4th Generation wRfx: NONREACTIVE

## 2017-12-06 MED ORDER — WARFARIN SODIUM 3 MG PO TABS: 3.5000 mg | ORAL_TABLET | Freq: Once | ORAL | Status: DC

## 2017-12-06 MED ORDER — CEFUROXIME AXETIL 500 MG PO TABS: 500.0000 mg | ORAL_TABLET | Freq: Two times a day (BID) | ORAL | 0 refills | Status: AC

## 2017-12-06 MED ORDER — WARFARIN SODIUM 5 MG PO TABS: 5.0000 mg | ORAL_TABLET | Freq: Every day | ORAL | 2 refills | Status: DC

## 2017-12-06 MED ORDER — WARFARIN SODIUM 3 MG PO TABS
3.0000 mg | ORAL_TABLET | Freq: Once | ORAL | Status: DC
  Filled 2017-12-06: qty 1

## 2017-12-06 NOTE — Progress Notes (Addendum)
ANTICOAGULATION CONSULT NOTE   Pharmacy Consult for warfarin dosing Indication: VTE prophylaxis  No Known Allergies  Patient Measurements: Height: 6\' 3"  (190.5 cm) Weight: 235 lb (106.6 kg) IBW/kg (Calculated) : 84.5 Heparin Dosing Weight: n/a  Vital Signs: Temp: 97.6 F (36.4 C) (09/07 0424) Temp Source: Oral (09/07 0424) BP: 131/96 (09/07 0424) Pulse Rate: 85 (09/07 0424)  Labs: Recent Labs    12/05/17 0045 12/05/17 0659 12/06/17 0509  HGB 14.3 14.0 13.4  HCT 42.8 41.8 40.2  PLT 216 192 204  LABPROT 17.5*  --  20.0*  INR 1.45  --  1.72  CREATININE 1.19 1.27*  --   TROPONINI <0.03  --   --     Estimated Creatinine Clearance: 79.6 mL/min (A) (by C-G formula based on SCr of 1.27 mg/dL (H)).   Medical History: Past Medical History:  Diagnosis Date  . Blood clot in vein 4 years ago   Location of clot unspecified in [facility] paper chart.    . Gout   . Hypertension     Medications:  Home dose of warfarin 3 mg daily.  Assessment: Pharmacy consulted for warfarin dosing and monitoring in 62 yo male with PMH of DVT and PE. INR subtherapeutic on admission. Patient received enoxaparin 1mg /kg x 1 dose in ED. Patient also started on antibiotics.   9/6  INR  1.45  Warfarin 4 mg 9/7  INR  1.72  Goal of Therapy:  INR 2-3    Plan:   Will order warfarin 4mg  PO x 1 dose tonight. Will check INRs daily while on antibiotics and while INR is not therapeutic. Enoxaparin 1mg /kg (100mg ) every 12 hours ordered while INR subtherapeutic. Will need to discontinue enoxaparin when INR is therapeutic.  Pharmacy will continue to follow labs and adjust dose as needed .  9/7:  Will order Warfarin 3 mg x 1 tonight.  Patient on CTX/Azithromycin. F/u INR in am  Angelique Blonder, PharmD, BCPS Clinical Pharmacist 12/06/2017 9:32 AM

## 2017-12-06 NOTE — Progress Notes (Signed)
Pt being discharged home, discharge instructions reviewed with pt, states understanding, pt with no complaints 

## 2017-12-06 NOTE — Discharge Summary (Signed)
[facility] - [place] [facility]   PATIENT NAME: Clifford Farley    MR#:  [id number]  DATE OF BIRTH:  March 31, 1956  DATE OF ADMISSION:  12/05/2017   ADMITTING PHYSICIAN: Oralia Manis, MD  DATE OF DISCHARGE: 12/06/2017  9:30 AM  PRIMARY CARE PHYSICIAN: Lauro Regulus, MD   ADMISSION DIAGNOSIS:   Weakness  DISCHARGE DIAGNOSIS:   Principal Problem:   Hypoxia Active Problems:   Hypertension   History of DVT (deep vein thrombosis)   SECONDARY DIAGNOSIS:   Past Medical History:  Diagnosis Date  . Blood clot in vein 4 years ago   Location of clot unspecified in [facility] paper chart.    . Gout   . Hypertension     HOSPITAL COURSE:   62 year old male with past medical history significant for DVT, hypertension and gout presents to hospital secondary to generalized fatigue and noted to be hypoxic in the emergency room  1.  Hypoxia-secondary to bronchitis.  CT angiogram was done due to history of DVT and subtherapeutic INR -Did not show any acute pulmonary embolus, no aortic dissection.  Did show bronchitis. -Started on azithromycin and Rocephin in the hospital.  Being discharged on Ceftin -Echo with normal EF and no wall motion abnormalities. -He was easily weaned off the oxygen.  Up and ambulating without any fevers.  2.  Hypertension-continue amlodipine and hydrochlorothiazide  3.  History of DVT-INR was subtherapeutic on admission.  Dose adjusted.  Advised to follow-up with PCP for INR check in 2 days. INR at discharge was 1.7  Patient has been ambulating well.  Will be discharged today  DISCHARGE CONDITIONS:   Guarded  CONSULTS OBTAINED:   Treatment Team:  Yevonne Pax, MD  DRUG ALLERGIES:   No Known Allergies DISCHARGE MEDICATIONS:   Allergies as of 12/06/2017   No Known Allergies     Medication List    TAKE these medications   amLODipine 5 MG tablet Commonly known as:  NORVASC Take 1 tablet (5 mg total) by mouth daily.     cefUROXime 500 MG tablet Commonly known as:  CEFTIN Take 1 tablet (500 mg total) by mouth 2 (two) times daily with a meal for 7 days.   hydrochlorothiazide 25 MG tablet Commonly known as:  HYDRODIURIL Take 1 tablet (25 mg total) by mouth daily.   polyethylene glycol packet Commonly known as:  MIRALAX / GLYCOLAX Take 17 g by mouth daily.   warfarin 5 MG tablet Commonly known as:  COUMADIN Take 1 tablet (5 mg total) by mouth daily. What changed:    medication strength  how much to take        DISCHARGE INSTRUCTIONS:   1.  PCP follow-up in 1 week 2.  INR check in 2 days  DIET:   Cardiac diet  ACTIVITY:   Activity as tolerated  OXYGEN:   Home Oxygen: No.  Oxygen Delivery: room air  DISCHARGE LOCATION:   home   If you experience worsening of your admission symptoms, develop shortness of breath, life threatening emergency, suicidal or homicidal thoughts you must seek medical attention immediately by calling 911 or calling your MD immediately  if symptoms less severe.  You Must read complete instructions/literature along with all the possible adverse reactions/side effects for all the Medicines you take and that have been prescribed to you. Take any new Medicines after you have completely understood and accpet all the possible adverse reactions/side effects.   Please note  You were cared for  by a hospitalist during your hospital stay. If you have any questions about your discharge medications or the care you received while you were in the hospital after you are discharged, you can call the unit and asked to speak with the hospitalist on call if the hospitalist that took care of you is not available. Once you are discharged, your primary care physician will handle any further medical issues. Please note that NO REFILLS for any discharge medications will be authorized once you are discharged, as it is imperative that you return to your primary care physician (or  establish a relationship with a primary care physician if you do not have one) for your aftercare needs so that they can reassess your need for medications and monitor your lab values.    On the day of Discharge:  VITAL SIGNS:   Blood pressure (!) 131/96, pulse 85, temperature 97.6 F (36.4 C), temperature source Oral, resp. rate 18, height 6\' 3"  (1.905 m), weight 106.6 kg, SpO2 100 %.  PHYSICAL EXAMINATION:    GENERAL:  62 y.o.-year-old patient lying in the bed with no acute distress.  EYES: Pupils equal, round, reactive to light and accommodation. No scleral icterus. Extraocular muscles intact.  HEENT: Head atraumatic, normocephalic. Oropharynx and nasopharynx clear.  NECK:  Supple, no jugular venous distention. No thyroid enlargement, no tenderness.  LUNGS: Normal breath sounds bilaterally, no wheezing, rales,rhonchi or crepitation. No use of accessory muscles of respiration.  Slightly decreased bibasilar breath sounds. CARDIOVASCULAR: S1, S2 normal. No murmurs, rubs, or gallops.  ABDOMEN: Soft, non-tender, non-distended. Bowel sounds present. No organomegaly or mass.  EXTREMITIES: No pedal edema, cyanosis, or clubbing.  NEUROLOGIC: Cranial nerves II through XII are intact. Muscle strength 5/5 in all extremities. Sensation intact. Gait not checked.  PSYCHIATRIC: The patient is alert and oriented x 3.  SKIN: No obvious rash, lesion, or ulcer.   DATA REVIEW:   CBC Recent Labs  Lab 12/06/17 0509  WBC 6.2  HGB 13.4  HCT 40.2  PLT 204    Chemistries  Recent Labs  Lab 12/05/17 0045 12/05/17 0659  NA 138 137  K 3.7 3.6  CL 105 103  CO2 24 23  GLUCOSE 114* 141*  BUN 14 15  CREATININE 1.19 1.27*  CALCIUM 9.3 9.0  AST 19  --   ALT 15  --   ALKPHOS 64  --   BILITOT 0.8  --      Microbiology Results  No results found for this or any previous visit.  RADIOLOGY:  No results found.   Management plans discussed with the patient, family and they are in  agreement.  CODE STATUS:  Code Status History    Date Active Date Inactive Code Status Order ID Comments User Context   12/05/2017 0521 12/06/2017 1313 Full Code [id number]  Oralia Manis, MD Inpatient      TOTAL TIME TAKING CARE OF THIS PATIENT: 38 minutes.    Enid Baas M.D on 12/06/2017 at 8:19 PM  Between 7am to 6pm - Pager - [phone]  After 6pm go to [email or web address] - Scientist, research (life sciences) [facility]  Office  [phone]  CC: Primary care physician; Lauro Regulus, MD   Note: This dictation was prepared with Dragon dictation along with smaller phrase technology. Any transcriptional errors that result from this process are unintentional.

## 2018-02-01 NOTE — Discharge Instructions (Signed)
°  Instructions after Total Knee Replacement ° ° Anokhi Shannon P. Minami Arriaga, Jr., M.D.    ° Dept. of Orthopaedics & Sports Medicine ° [facility] ° [address] ° [place], [place]  [address] ° Phone: [phone]   Fax: [phone] ° °  °DIET: °• Drink plenty of non-alcoholic fluids. °• Resume your normal diet. Include foods high in fiber. ° °ACTIVITY:  °• You may use crutches or a walker with weight-bearing as tolerated, unless instructed otherwise. °• You may be weaned off of the walker or crutches by your Physical Therapist.  °• Do NOT place pillows under the knee. Anything placed under the knee could limit your ability to straighten the knee.   °• Continue doing gentle exercises. Exercising will reduce the pain and swelling, increase motion, and prevent muscle weakness.   °• Please continue to use the TED compression stockings for 6 weeks. You may remove the stockings at night, but should reapply them in the morning. °• Do not drive or operate any equipment until instructed. ° °WOUND CARE:  °• Continue to use the PolarCare or ice packs periodically to reduce pain and swelling. °• You may bathe or shower after the staples are removed at the first office visit following surgery. ° °MEDICATIONS: °• You may resume your regular medications. °• Please take the pain medication as prescribed on the medication. °• Do not take pain medication on an empty stomach. °• You have been given a prescription for a blood thinner (Lovenox or Coumadin). Please take the medication as instructed. (NOTE: After completing a 2 week course of Lovenox, take one Enteric-coated aspirin once a day. This along with elevation will help reduce the possibility of phlebitis in your operated leg.) °• Do not drive or drink alcoholic beverages when taking pain medications. ° °CALL THE OFFICE FOR: °• Temperature above 101 degrees °• Excessive bleeding or drainage on the dressing. °• Excessive swelling, coldness, or paleness of the toes. °• Persistent  nausea and vomiting. ° °FOLLOW-UP:  °• You should have an appointment to return to the office in 10-14 days after surgery. °• Arrangements have been made for continuation of Physical Therapy (either home therapy or outpatient therapy). °  °

## 2018-02-04 ENCOUNTER — Other Ambulatory Visit: Payer: Self-pay

## 2018-02-04 ENCOUNTER — Encounter
Admission: RE | Admit: 2018-02-04 | Discharge: 2018-02-04 | Disposition: A | Discharge status: Home / Self Care | Payer: Medicare Other | Source: Ambulatory Visit | Attending: Orthopedic Surgery | Admitting: Orthopedic Surgery

## 2018-02-04 DIAGNOSIS — Z01812 Encounter for preprocedural laboratory examination: Secondary | ICD-10-CM | POA: Insufficient documentation

## 2018-02-04 LAB — URINALYSIS, ROUTINE W REFLEX MICROSCOPIC
Bacteria, UA: NONE SEEN
Bilirubin Urine: NEGATIVE
GLUCOSE, UA: NEGATIVE mg/dL
Ketones, ur: NEGATIVE mg/dL
Leukocytes, UA: NEGATIVE
Nitrite: NEGATIVE
PH: 5 (ref 5.0–8.0)
PROTEIN: NEGATIVE mg/dL
SQUAMOUS EPITHELIAL / LPF: NONE SEEN (ref 0–5)
Specific Gravity, Urine: 1.014 (ref 1.005–1.030)

## 2018-02-04 LAB — COMPREHENSIVE METABOLIC PANEL
ALBUMIN: 4.2 g/dL (ref 3.5–5.0)
ALT: 13 U/L (ref 0–44)
ANION GAP: 9 (ref 5–15)
AST: 15 U/L (ref 15–41)
Alkaline Phosphatase: 69 U/L (ref 38–126)
BUN: 13 mg/dL (ref 8–23)
CO2: 30 mmol/L (ref 22–32)
Calcium: 9.3 mg/dL (ref 8.9–10.3)
Chloride: 101 mmol/L (ref 98–111)
Creatinine, Ser: 1.13 mg/dL (ref 0.61–1.24)
GFR calc non Af Amer: >60 mL/min (ref >60)
GLUCOSE: 123 mg/dL — AB (ref 70–99)
POTASSIUM: 3.2 mmol/L — AB (ref 3.5–5.1)
SODIUM: 140 mmol/L (ref 135–145)
Total Bilirubin: 0.5 mg/dL (ref 0.3–1.2)
Total Protein: 7.8 g/dL (ref 6.5–8.1)

## 2018-02-04 LAB — CBC
HCT: 42 % (ref 39.0–52.0)
Hemoglobin: 13.3 g/dL (ref 13.0–17.0)
MCH: 25.8 pg — ABNORMAL LOW (ref 26.0–34.0)
MCHC: 31.7 g/dL (ref 30.0–36.0)
MCV: 81.6 fL (ref 80.0–100.0)
PLATELETS: 237 10*3/uL (ref 150–400)
RBC: 5.15 MIL/uL (ref 4.22–5.81)
RDW: 15.3 % (ref 11.5–15.5)
WBC: 4.3 10*3/uL (ref 4.0–10.5)
nRBC: 0 % (ref 0.0–0.2)

## 2018-02-04 LAB — SEDIMENTATION RATE: SED RATE: 18 mm/h (ref 0–20)

## 2018-02-04 LAB — TYPE AND SCREEN
ABO/RH(D): O NEG
ANTIBODY SCREEN: NEGATIVE

## 2018-02-04 LAB — PROTIME-INR
INR: 1.57
Prothrombin Time: 18.6 seconds — ABNORMAL HIGH (ref 11.4–15.2)

## 2018-02-04 LAB — SURGICAL PCR SCREEN
MRSA, PCR: NEGATIVE
STAPHYLOCOCCUS AUREUS: NEGATIVE

## 2018-02-04 LAB — APTT: APTT: 36 s (ref 24–36)

## 2018-02-04 LAB — C-REACTIVE PROTEIN: CRP: 1.6 mg/dL — ABNORMAL HIGH (ref <1.0)

## 2018-02-04 NOTE — Patient Instructions (Signed)
Your procedure is scheduled on: [facility] 02/18/18 Report to DAY SURGERY DEPARTMENT LOCATED ON 2ND FLOOR MEDICAL MALL ENTRANCE. To find out your arrival time please call [phone] between 1PM - 3PM Tuesday 02/17/18.  Remember: Instructions that are not followed completely may result in serious medical risk, up to and including death, or upon the discretion of your surgeon and anesthesiologist your surgery may need to be rescheduled.     _X__ 1. Do not eat food after midnight the night before your procedure.                 No gum chewing or hard candies. You may drink clear liquids up to 2 hours                 before you are scheduled to arrive for your surgery- DO not drink clear                 liquids within 2 hours of the start of your surgery.                 Clear Liquids include:  water, apple juice without pulp, clear carbohydrate                 drink such as Clearfast or Gatorade, Black Coffee or Tea (Do not add                 anything to coffee or tea).  __X__2.  On the morning of surgery brush your teeth with toothpaste and water, you                 may rinse your mouth with mouthwash if you wish.  Do not swallow any              toothpaste of mouthwash.     _X__ 3.  No Alcohol for 24 hours before or after surgery.   _X__ 4.  Do Not Smoke or use e-cigarettes For 24 Hours Prior to Your Surgery.                 Do not use any chewable tobacco products for at least 6 hours prior to                 surgery.  ____  5.  Bring all medications with you on the day of surgery if instructed.   __X__  6.  Notify your doctor if there is any change in your medical condition      (cold, fever, infections).     Do not wear jewelry, make-up, hairpins, clips or nail polish. Do not wear lotions, powders, or perfumes.  Do not shave 48 hours prior to surgery. Men may shave face and neck. Do not bring valuables to the hospital.    [facility] is not responsible for any belongings or  valuables.  Contacts, dentures/partials or body piercings may not be worn into surgery. Bring a case for your contacts, glasses or hearing aids, a denture cup will be supplied. Leave your suitcase in the car. After surgery it may be brought to your room. For patients admitted to the hospital, discharge time is determined by your treatment team.   Patients discharged the day of surgery will not be allowed to drive home.   Please read over the following fact sheets that you were given:   MRSA Information  __X__ Take these medicines the morning of surgery with A SIP OF WATER:    1.  NONE (BUT BE SURE TO TAKE YOUR BLOOD PRESSURE MEDICINES AS USUAL THE EVENING BEFORE)  2.   3.   4.  5.  6.  ____ Fleet Enema (as directed)   __X__ Use CHG Soap/SAGE wipes as directed  ____ Use inhalers on the day of surgery  ____ Stop metformin/Janumet/Farxiga 2 days prior to surgery    ____ Take 1/2 of usual insulin dose the night before surgery. No insulin the morning          of surgery.   __X__ Stop Blood Thinners Coumadin/Plavix/Xarelto/Pleta/Pradaxa/Eliquis/Effient/Aspirin  on   Or contact your Surgeon, Cardiologist or Medical Doctor regarding  ability to stop your blood thinners   (ACCORDING TO DR ANDERSON'S INSTRUCTIONS)  __X__ Stop Anti-inflammatories 7 days before surgery such as Advil, Ibuprofen, Motrin,  BC or Goodies Powder, Naprosyn, Naproxen, Aleve, Aspirin (TYLENOL IS OK)   __X__ Stop all herbal supplements, fish oil or vitamin E until after surgery.    ____ Bring C-Pap to the hospital.

## 2018-02-04 NOTE — Pre-Procedure Instructions (Signed)
01/14/18 OV NOTE FROM DR Dareen Piano ON CHART. STARTING LOVENOX BRIDGE

## 2018-02-05 LAB — URINE CULTURE
CULTURE: NO GROWTH
SPECIAL REQUESTS: NORMAL

## 2018-02-17 MED ORDER — CEFAZOLIN SODIUM-DEXTROSE 2-4 GM/100ML-% IV SOLN
2.0000 g | INTRAVENOUS | Status: AC
  Administered 2018-02-18: 2 g via INTRAVENOUS

## 2018-02-17 MED ORDER — TRANEXAMIC ACID-NACL 1000-0.7 MG/100ML-% IV SOLN
1000.0000 mg | INTRAVENOUS | Status: AC
  Administered 2018-02-18: 1000 mg via INTRAVENOUS
  Filled 2018-02-17: qty 100

## 2018-02-18 ENCOUNTER — Inpatient Hospital Stay: Payer: Medicare Other | Admitting: Anesthesiology

## 2018-02-18 ENCOUNTER — Encounter
Admission: RE | Disposition: A | Discharge status: Home Health | Payer: Self-pay | Source: Home / Self Care | Attending: Orthopedic Surgery

## 2018-02-18 ENCOUNTER — Inpatient Hospital Stay: Payer: Medicare Other

## 2018-02-18 ENCOUNTER — Inpatient Hospital Stay
Admission: RE | Admit: 2018-02-18 | Discharge: 2018-02-20 | DRG: 470 | Disposition: A | Discharge status: Home Health | Payer: Medicare Other | Attending: Orthopedic Surgery | Admitting: Orthopedic Surgery

## 2018-02-18 ENCOUNTER — Encounter: Payer: Self-pay | Admitting: Orthopedic Surgery

## 2018-02-18 ENCOUNTER — Other Ambulatory Visit: Payer: Self-pay

## 2018-02-18 DIAGNOSIS — M1712 Unilateral primary osteoarthritis, left knee: Secondary | ICD-10-CM | POA: Diagnosis present

## 2018-02-18 DIAGNOSIS — Z86718 Personal history of other venous thrombosis and embolism: Secondary | ICD-10-CM

## 2018-02-18 DIAGNOSIS — I10 Essential (primary) hypertension: Secondary | ICD-10-CM | POA: Diagnosis present

## 2018-02-18 DIAGNOSIS — Z86711 Personal history of pulmonary embolism: Secondary | ICD-10-CM

## 2018-02-18 DIAGNOSIS — Z7901 Long term (current) use of anticoagulants: Secondary | ICD-10-CM

## 2018-02-18 DIAGNOSIS — Z96659 Presence of unspecified artificial knee joint: Secondary | ICD-10-CM

## 2018-02-18 DIAGNOSIS — Z79899 Other long term (current) drug therapy: Secondary | ICD-10-CM | POA: Diagnosis not present

## 2018-02-18 DIAGNOSIS — M109 Gout, unspecified: Secondary | ICD-10-CM | POA: Diagnosis present

## 2018-02-18 HISTORY — PX: KNEE ARTHROPLASTY: SHX992

## 2018-02-18 LAB — CBC
HEMATOCRIT: 40.3 % (ref 39.0–52.0)
Hemoglobin: 12.9 g/dL — ABNORMAL LOW (ref 13.0–17.0)
MCH: 25.9 pg — AB (ref 26.0–34.0)
MCHC: 32 g/dL (ref 30.0–36.0)
MCV: 80.8 fL (ref 80.0–100.0)
Platelets: 210 10*3/uL (ref 150–400)
RBC: 4.99 MIL/uL (ref 4.22–5.81)
RDW: 15.7 % — ABNORMAL HIGH (ref 11.5–15.5)
WBC: 3.6 10*3/uL — AB (ref 4.0–10.5)
nRBC: 0 % (ref 0.0–0.2)

## 2018-02-18 LAB — ABO/RH: ABO/RH(D): O NEG

## 2018-02-18 LAB — PROTIME-INR
INR: 1.02
PROTHROMBIN TIME: 13.3 s (ref 11.4–15.2)

## 2018-02-18 SURGERY — ARTHROPLASTY, KNEE, TOTAL, USING IMAGELESS COMPUTER-ASSISTED NAVIGATION
Anesthesia: General | Site: Knee | Laterality: Left

## 2018-02-18 MED ORDER — ACETAMINOPHEN 10 MG/ML IV SOLN
INTRAVENOUS | Status: AC
  Filled 2018-02-18: qty 100

## 2018-02-18 MED ORDER — ONDANSETRON HCL 4 MG/2ML IJ SOLN: 4.0000 mg | Freq: Four times a day (QID) | INTRAMUSCULAR | Status: DC | PRN

## 2018-02-18 MED ORDER — NEOMYCIN-POLYMYXIN B GU 40-200000 IR SOLN
Status: AC
  Filled 2018-02-18: qty 20

## 2018-02-18 MED ORDER — AMLODIPINE BESYLATE 5 MG PO TABS
5.0000 mg | ORAL_TABLET | Freq: Every day | ORAL | Status: DC
  Administered 2018-02-18 – 2018-02-19 (×2): 5 mg via ORAL
  Filled 2018-02-18 (×2): qty 1

## 2018-02-18 MED ORDER — ACETAMINOPHEN 10 MG/ML IV SOLN
1000.0000 mg | Freq: Four times a day (QID) | INTRAVENOUS | Status: AC
  Administered 2018-02-18 – 2018-02-19 (×4): 1000 mg via INTRAVENOUS
  Filled 2018-02-18 (×5): qty 100

## 2018-02-18 MED ORDER — MENTHOL 3 MG MT LOZG
1.0000 | LOZENGE | OROMUCOSAL | Status: DC | PRN
  Filled 2018-02-18: qty 9

## 2018-02-18 MED ORDER — GABAPENTIN 300 MG PO CAPS
300.0000 mg | ORAL_CAPSULE | Freq: Every day | ORAL | Status: DC
  Administered 2018-02-18 – 2018-02-19 (×2): 300 mg via ORAL
  Filled 2018-02-18 (×2): qty 1

## 2018-02-18 MED ORDER — BUPIVACAINE HCL (PF) 0.25 % IJ SOLN
INTRAMUSCULAR | Status: DC | PRN
  Administered 2018-02-18: 60 mL

## 2018-02-18 MED ORDER — BUPIVACAINE LIPOSOME 1.3 % IJ SUSP
INTRAMUSCULAR | Status: AC
  Filled 2018-02-18: qty 20

## 2018-02-18 MED ORDER — ALUM & MAG HYDROXIDE-SIMETH 200-200-20 MG/5ML PO SUSP: 30.0000 mL | ORAL | Status: DC | PRN

## 2018-02-18 MED ORDER — SENNOSIDES-DOCUSATE SODIUM 8.6-50 MG PO TABS
1.0000 | ORAL_TABLET | Freq: Two times a day (BID) | ORAL | Status: DC
  Administered 2018-02-18 – 2018-02-20 (×4): 1 via ORAL
  Filled 2018-02-18 (×4): qty 1

## 2018-02-18 MED ORDER — BUPIVACAINE HCL (PF) 0.25 % IJ SOLN
INTRAMUSCULAR | Status: AC
  Filled 2018-02-18: qty 30

## 2018-02-18 MED ORDER — ROCURONIUM BROMIDE 50 MG/5ML IV SOLN
INTRAVENOUS | Status: AC
  Filled 2018-02-18: qty 2

## 2018-02-18 MED ORDER — FENTANYL CITRATE (PF) 250 MCG/5ML IJ SOLN
INTRAMUSCULAR | Status: AC
  Filled 2018-02-18: qty 5

## 2018-02-18 MED ORDER — MIDAZOLAM HCL 2 MG/2ML IJ SOLN
INTRAMUSCULAR | Status: AC
  Filled 2018-02-18: qty 2

## 2018-02-18 MED ORDER — LIDOCAINE HCL (CARDIAC) PF 100 MG/5ML IV SOSY
PREFILLED_SYRINGE | INTRAVENOUS | Status: DC | PRN
  Administered 2018-02-18 (×2): 100 mg via INTRAVENOUS

## 2018-02-18 MED ORDER — WARFARIN SODIUM 3 MG PO TABS
3.0000 mg | ORAL_TABLET | Freq: Once | ORAL | Status: AC
  Administered 2018-02-19: 3 mg via ORAL
  Filled 2018-02-18: qty 1

## 2018-02-18 MED ORDER — PHENYLEPHRINE HCL 10 MG/ML IJ SOLN
INTRAVENOUS | Status: DC | PRN
  Administered 2018-02-18: 30 ug/min via INTRAVENOUS

## 2018-02-18 MED ORDER — OXYCODONE HCL 5 MG/5ML PO SOLN: 5.0000 mg | Freq: Once | ORAL | Status: DC | PRN

## 2018-02-18 MED ORDER — LIDOCAINE HCL (PF) 2 % IJ SOLN
INTRAMUSCULAR | Status: AC
  Filled 2018-02-18: qty 50

## 2018-02-18 MED ORDER — ONDANSETRON HCL 4 MG PO TABS: 4.0000 mg | ORAL_TABLET | Freq: Four times a day (QID) | ORAL | Status: DC | PRN

## 2018-02-18 MED ORDER — SODIUM CHLORIDE 0.9 % IV SOLN
INTRAVENOUS | Status: DC | PRN
  Administered 2018-02-18: 60 mL

## 2018-02-18 MED ORDER — CEFAZOLIN SODIUM-DEXTROSE 2-4 GM/100ML-% IV SOLN
INTRAVENOUS | Status: AC
  Filled 2018-02-18: qty 100

## 2018-02-18 MED ORDER — CHLORHEXIDINE GLUCONATE 4 % EX LIQD: 60.0000 mL | Freq: Once | CUTANEOUS | Status: DC

## 2018-02-18 MED ORDER — PROPOFOL 10 MG/ML IV BOLUS
INTRAVENOUS | Status: DC | PRN
  Administered 2018-02-18: 170 mg via INTRAVENOUS

## 2018-02-18 MED ORDER — FENTANYL CITRATE (PF) 100 MCG/2ML IJ SOLN
INTRAMUSCULAR | Status: AC
  Administered 2018-02-18: 25 ug via INTRAVENOUS
  Filled 2018-02-18: qty 2

## 2018-02-18 MED ORDER — WARFARIN - PHARMACIST DOSING INPATIENT: Freq: Every day | Status: DC

## 2018-02-18 MED ORDER — DIPHENHYDRAMINE HCL 12.5 MG/5ML PO ELIX: 12.5000 mg | ORAL_SOLUTION | ORAL | Status: DC | PRN

## 2018-02-18 MED ORDER — SUGAMMADEX SODIUM 200 MG/2ML IV SOLN
INTRAVENOUS | Status: DC | PRN
  Administered 2018-02-18: 200 mg via INTRAVENOUS

## 2018-02-18 MED ORDER — PANTOPRAZOLE SODIUM 40 MG PO TBEC
40.0000 mg | DELAYED_RELEASE_TABLET | Freq: Two times a day (BID) | ORAL | Status: DC
  Administered 2018-02-18 – 2018-02-20 (×4): 40 mg via ORAL
  Filled 2018-02-18 (×4): qty 1

## 2018-02-18 MED ORDER — BISACODYL 10 MG RE SUPP
10.0000 mg | Freq: Every day | RECTAL | Status: DC | PRN
  Filled 2018-02-18: qty 1

## 2018-02-18 MED ORDER — SODIUM CHLORIDE 0.9 % IV SOLN
INTRAVENOUS | Status: DC
  Administered 2018-02-18 – 2018-02-19 (×2): via INTRAVENOUS

## 2018-02-18 MED ORDER — CELECOXIB 200 MG PO CAPS: 400.0000 mg | ORAL_CAPSULE | Freq: Once | ORAL | Status: DC

## 2018-02-18 MED ORDER — METOCLOPRAMIDE HCL 10 MG PO TABS: 5.0000 mg | ORAL_TABLET | Freq: Three times a day (TID) | ORAL | Status: DC | PRN

## 2018-02-18 MED ORDER — LACTATED RINGERS IV SOLN
INTRAVENOUS | Status: DC
  Administered 2018-02-18 (×2): via INTRAVENOUS

## 2018-02-18 MED ORDER — PROPOFOL 10 MG/ML IV BOLUS
INTRAVENOUS | Status: AC
  Filled 2018-02-18: qty 20

## 2018-02-18 MED ORDER — OXYCODONE HCL 5 MG PO TABS: 5.0000 mg | ORAL_TABLET | Freq: Once | ORAL | Status: DC | PRN

## 2018-02-18 MED ORDER — NEOMYCIN-POLYMYXIN B GU 40-200000 IR SOLN
Status: DC | PRN
  Administered 2018-02-18: 14 mL

## 2018-02-18 MED ORDER — KETAMINE HCL 50 MG/ML IJ SOLN
INTRAMUSCULAR | Status: AC
  Filled 2018-02-18: qty 10

## 2018-02-18 MED ORDER — FAMOTIDINE 20 MG PO TABS
ORAL_TABLET | ORAL | Status: AC
  Administered 2018-02-18: 20 mg
  Filled 2018-02-18: qty 1

## 2018-02-18 MED ORDER — SODIUM CHLORIDE FLUSH 0.9 % IV SOLN
INTRAVENOUS | Status: AC
  Filled 2018-02-18: qty 40

## 2018-02-18 MED ORDER — PHENYLEPHRINE HCL 10 MG/ML IJ SOLN
INTRAMUSCULAR | Status: DC | PRN
  Administered 2018-02-18: 50 ug via INTRAVENOUS
  Administered 2018-02-18: 100 ug via INTRAVENOUS
  Administered 2018-02-18: 50 ug via INTRAVENOUS
  Administered 2018-02-18 (×2): 100 ug via INTRAVENOUS
  Administered 2018-02-18: 200 ug via INTRAVENOUS

## 2018-02-18 MED ORDER — TRANEXAMIC ACID-NACL 1000-0.7 MG/100ML-% IV SOLN
1000.0000 mg | Freq: Once | INTRAVENOUS | Status: AC
  Administered 2018-02-18: 1000 mg via INTRAVENOUS
  Filled 2018-02-18: qty 100

## 2018-02-18 MED ORDER — LIDOCAINE IN D5W 4-5 MG/ML-% IV SOLN
INTRAVENOUS | Status: DC | PRN
  Administered 2018-02-18: 25 ug/kg/min via INTRAVENOUS

## 2018-02-18 MED ORDER — FENTANYL CITRATE (PF) 100 MCG/2ML IJ SOLN
25.0000 ug | INTRAMUSCULAR | Status: AC | PRN
  Administered 2018-02-18: 25 ug via INTRAVENOUS
  Administered 2018-02-18: 50 ug via INTRAVENOUS
  Administered 2018-02-18 (×2): 25 ug via INTRAVENOUS
  Administered 2018-02-18: 50 ug via INTRAVENOUS
  Administered 2018-02-18: 25 ug via INTRAVENOUS

## 2018-02-18 MED ORDER — FAMOTIDINE 20 MG PO TABS: 20.0000 mg | ORAL_TABLET | Freq: Once | ORAL | Status: DC

## 2018-02-18 MED ORDER — ACETAMINOPHEN 10 MG/ML IV SOLN
INTRAVENOUS | Status: DC | PRN
  Administered 2018-02-18: 1000 mg via INTRAVENOUS

## 2018-02-18 MED ORDER — DEXAMETHASONE SODIUM PHOSPHATE 10 MG/ML IJ SOLN
8.0000 mg | Freq: Once | INTRAMUSCULAR | Status: AC
  Administered 2018-02-18: 8 mg via INTRAVENOUS

## 2018-02-18 MED ORDER — FENTANYL CITRATE (PF) 100 MCG/2ML IJ SOLN
INTRAMUSCULAR | Status: DC | PRN
  Administered 2018-02-18: 25 ug via INTRAVENOUS
  Administered 2018-02-18: 75 ug via INTRAVENOUS
  Administered 2018-02-18 (×3): 50 ug via INTRAVENOUS

## 2018-02-18 MED ORDER — CELECOXIB 200 MG PO CAPS
ORAL_CAPSULE | ORAL | Status: AC
  Administered 2018-02-18: 400 mg
  Filled 2018-02-18: qty 2

## 2018-02-18 MED ORDER — HYDROCHLOROTHIAZIDE 25 MG PO TABS
25.0000 mg | ORAL_TABLET | Freq: Every day | ORAL | Status: DC
  Administered 2018-02-18 – 2018-02-19 (×2): 25 mg via ORAL
  Filled 2018-02-18 (×2): qty 1

## 2018-02-18 MED ORDER — DEXAMETHASONE SODIUM PHOSPHATE 10 MG/ML IJ SOLN
INTRAMUSCULAR | Status: AC
  Filled 2018-02-18: qty 1

## 2018-02-18 MED ORDER — ACETAMINOPHEN 325 MG PO TABS: 325.0000 mg | ORAL_TABLET | Freq: Four times a day (QID) | ORAL | Status: DC | PRN

## 2018-02-18 MED ORDER — METOCLOPRAMIDE HCL 10 MG PO TABS
10.0000 mg | ORAL_TABLET | Freq: Three times a day (TID) | ORAL | Status: DC
  Administered 2018-02-18 – 2018-02-20 (×6): 10 mg via ORAL
  Filled 2018-02-18 (×6): qty 1

## 2018-02-18 MED ORDER — PHENOL 1.4 % MT LIQD
1.0000 | OROMUCOSAL | Status: DC | PRN
  Filled 2018-02-18: qty 177

## 2018-02-18 MED ORDER — METOCLOPRAMIDE HCL 5 MG/ML IJ SOLN: 5.0000 mg | Freq: Three times a day (TID) | INTRAMUSCULAR | Status: DC | PRN

## 2018-02-18 MED ORDER — FERROUS SULFATE 325 (65 FE) MG PO TABS
325.0000 mg | ORAL_TABLET | Freq: Two times a day (BID) | ORAL | Status: DC
  Administered 2018-02-19 – 2018-02-20 (×3): 325 mg via ORAL
  Filled 2018-02-18 (×3): qty 1

## 2018-02-18 MED ORDER — OXYCODONE HCL 5 MG PO TABS
10.0000 mg | ORAL_TABLET | ORAL | Status: DC | PRN
  Filled 2018-02-18: qty 2

## 2018-02-18 MED ORDER — CEFAZOLIN SODIUM-DEXTROSE 2-4 GM/100ML-% IV SOLN
2.0000 g | Freq: Four times a day (QID) | INTRAVENOUS | Status: AC
  Administered 2018-02-18 – 2018-02-19 (×4): 2 g via INTRAVENOUS
  Filled 2018-02-18 (×4): qty 100

## 2018-02-18 MED ORDER — MIDAZOLAM HCL 2 MG/2ML IJ SOLN
INTRAMUSCULAR | Status: DC | PRN
  Administered 2018-02-18: 2 mg via INTRAVENOUS

## 2018-02-18 MED ORDER — ROCURONIUM BROMIDE 50 MG/5ML IV SOLN
INTRAVENOUS | Status: AC
  Filled 2018-02-18: qty 1

## 2018-02-18 MED ORDER — ONDANSETRON HCL 4 MG/2ML IJ SOLN
INTRAMUSCULAR | Status: DC | PRN
  Administered 2018-02-18: 4 mg via INTRAVENOUS

## 2018-02-18 MED ORDER — TRAMADOL HCL 50 MG PO TABS
50.0000 mg | ORAL_TABLET | ORAL | Status: DC | PRN
  Administered 2018-02-19: 100 mg via ORAL
  Filled 2018-02-18 (×2): qty 2

## 2018-02-18 MED ORDER — FLEET ENEMA 7-19 GM/118ML RE ENEM: 1.0000 | ENEMA | Freq: Once | RECTAL | Status: DC | PRN

## 2018-02-18 MED ORDER — HYDROMORPHONE HCL 1 MG/ML IJ SOLN: 0.5000 mg | INTRAMUSCULAR | Status: DC | PRN

## 2018-02-18 MED ORDER — GABAPENTIN 300 MG PO CAPS: 300.0000 mg | ORAL_CAPSULE | Freq: Once | ORAL | Status: DC

## 2018-02-18 MED ORDER — ROCURONIUM BROMIDE 100 MG/10ML IV SOLN
INTRAVENOUS | Status: DC | PRN
  Administered 2018-02-18: 100 mg via INTRAVENOUS
  Administered 2018-02-18: 30 mg via INTRAVENOUS

## 2018-02-18 MED ORDER — MAGNESIUM HYDROXIDE 400 MG/5ML PO SUSP
30.0000 mL | Freq: Every day | ORAL | Status: DC
  Administered 2018-02-19 – 2018-02-20 (×2): 30 mL via ORAL
  Filled 2018-02-18 (×2): qty 30

## 2018-02-18 MED ORDER — GABAPENTIN 300 MG PO CAPS
ORAL_CAPSULE | ORAL | Status: AC
  Administered 2018-02-18: 300 mg
  Filled 2018-02-18: qty 1

## 2018-02-18 MED ORDER — KETAMINE HCL 50 MG/ML IJ SOLN
INTRAMUSCULAR | Status: DC | PRN
  Administered 2018-02-18: 50 mg via INTRAVENOUS

## 2018-02-18 MED ORDER — BUPIVACAINE HCL (PF) 0.25 % IJ SOLN
INTRAMUSCULAR | Status: AC
  Filled 2018-02-18: qty 60

## 2018-02-18 MED ORDER — CELECOXIB 200 MG PO CAPS
200.0000 mg | ORAL_CAPSULE | Freq: Two times a day (BID) | ORAL | Status: DC
  Administered 2018-02-18 – 2018-02-20 (×4): 200 mg via ORAL
  Filled 2018-02-18 (×4): qty 1

## 2018-02-18 MED ORDER — OXYCODONE HCL 5 MG PO TABS
5.0000 mg | ORAL_TABLET | ORAL | Status: DC | PRN
  Administered 2018-02-18 – 2018-02-20 (×2): 5 mg via ORAL
  Filled 2018-02-18 (×2): qty 1

## 2018-02-18 SURGICAL SUPPLY — 71 items
ATTUNE MED DOME PAT 38 KNEE (Knees) ×2 IMPLANT
ATTUNE MED DOME PAT 38MM KNEE (Knees) ×1 IMPLANT
ATTUNE PS FEM LT SZ 8 CEM KNEE (Femur) ×3 IMPLANT
ATTUNE PSRP INSR SZ8 5 KNEE (Insert) ×2 IMPLANT
ATTUNE PSRP INSR SZ8 5MM KNEE (Insert) ×1 IMPLANT
BASE TIBIAL ROT PLAT SZ 8 KNEE (Knees) ×1 IMPLANT
BATTERY INSTRU NAVIGATION (MISCELLANEOUS) ×12 IMPLANT
BLADE SAW 70X12.5 (BLADE) ×3 IMPLANT
BLADE SAW 90X13X1.19 OSCILLAT (BLADE) ×3 IMPLANT
BLADE SAW 90X25X1.19 OSCILLAT (BLADE) ×3 IMPLANT
CANISTER SUCT 1200ML W/VALVE (MISCELLANEOUS) ×3 IMPLANT
CANISTER SUCT 3000ML PPV (MISCELLANEOUS) ×6 IMPLANT
CEMENT HV SMART SET (Cement) ×6 IMPLANT
COOLER POLAR GLACIER W/PUMP (MISCELLANEOUS) ×3 IMPLANT
COVER WAND RF STERILE (DRAPES) ×3 IMPLANT
CUFF TOURN 24 STER (MISCELLANEOUS) IMPLANT
CUFF TOURN 30 STER DUAL PORT (MISCELLANEOUS) IMPLANT
DRAPE SHEET LG 3/4 BI-LAMINATE (DRAPES) ×3 IMPLANT
DRSG DERMACEA 8X12 NADH (GAUZE/BANDAGES/DRESSINGS) ×3 IMPLANT
DRSG OPSITE POSTOP 4X14 (GAUZE/BANDAGES/DRESSINGS) ×3 IMPLANT
DRSG TEGADERM 4X4.75 (GAUZE/BANDAGES/DRESSINGS) ×3 IMPLANT
DURAPREP 26ML APPLICATOR (WOUND CARE) ×6 IMPLANT
ELECT CAUTERY BLADE 6.4 (BLADE) ×3 IMPLANT
ELECT REM PT RETURN 9FT ADLT (ELECTROSURGICAL) ×3
ELECTRODE REM PT RTRN 9FT ADLT (ELECTROSURGICAL) ×1 IMPLANT
EX-PIN ORTHOLOCK NAV 4X150 (PIN) ×6 IMPLANT
GLOVE BIOGEL M STRL SZ7.5 (GLOVE) ×6 IMPLANT
GLOVE BIOGEL PI IND STRL 9 (GLOVE) ×1 IMPLANT
GLOVE BIOGEL PI INDICATOR 9 (GLOVE) ×2
GLOVE INDICATOR 8.0 STRL GRN (GLOVE) ×3 IMPLANT
GLOVE SURG SYN 9.0  PF PI (GLOVE) ×2
GLOVE SURG SYN 9.0 PF PI (GLOVE) ×1 IMPLANT
GOWN STRL REUS W/ TWL LRG LVL3 (GOWN DISPOSABLE) ×3 IMPLANT
GOWN STRL REUS W/TWL 2XL LVL3 (GOWN DISPOSABLE) ×3 IMPLANT
GOWN STRL REUS W/TWL LRG LVL3 (GOWN DISPOSABLE) ×6
HEMOVAC 400CC 10FR (MISCELLANEOUS) ×3 IMPLANT
HOLDER FOLEY CATH W/STRAP (MISCELLANEOUS) ×3 IMPLANT
HOOD PEEL AWAY FLYTE STAYCOOL (MISCELLANEOUS) ×9 IMPLANT
KIT TURNOVER KIT A (KITS) ×3 IMPLANT
KNIFE SCULPS 14X20 (INSTRUMENTS) ×3 IMPLANT
LABEL OR SOLS (LABEL) ×3 IMPLANT
NDL SAFETY ECLIPSE 18X1.5 (NEEDLE) ×1 IMPLANT
NEEDLE HYPO 18GX1.5 SHARP (NEEDLE) ×2
NEEDLE SPNL 20GX3.5 QUINCKE YW (NEEDLE) ×6 IMPLANT
NS IRRIG 500ML POUR BTL (IV SOLUTION) ×3 IMPLANT
PACK TOTAL KNEE (MISCELLANEOUS) ×3 IMPLANT
PAD WRAPON POLAR KNEE (MISCELLANEOUS) ×1 IMPLANT
PENCIL SMOKE ULTRAEVAC 22 CON (MISCELLANEOUS) ×3 IMPLANT
PIN DRILL QUICK PACK ×3 IMPLANT
PIN FIXATION 1/8DIA X 3INL (PIN) ×9 IMPLANT
PULSAVAC PLUS IRRIG FAN TIP (DISPOSABLE) ×3
SOL .9 NS 3000ML IRR  AL (IV SOLUTION) ×2
SOL .9 NS 3000ML IRR UROMATIC (IV SOLUTION) ×1 IMPLANT
SOL PREP PVP 2OZ (MISCELLANEOUS) ×3
SOLUTION PREP PVP 2OZ (MISCELLANEOUS) ×1 IMPLANT
SPONGE DRAIN TRACH 4X4 STRL 2S (GAUZE/BANDAGES/DRESSINGS) ×3 IMPLANT
STAPLER SKIN PROX 35W (STAPLE) ×3 IMPLANT
STRAP TIBIA SHORT (MISCELLANEOUS) ×3 IMPLANT
SUCTION FRAZIER HANDLE 10FR (MISCELLANEOUS) ×2
SUCTION TUBE FRAZIER 10FR DISP (MISCELLANEOUS) ×1 IMPLANT
SUT VIC AB 0 CT1 36 (SUTURE) ×6 IMPLANT
SUT VIC AB 1 CT1 36 (SUTURE) ×6 IMPLANT
SUT VIC AB 2-0 CT2 27 (SUTURE) ×3 IMPLANT
SYR 20CC LL (SYRINGE) ×3 IMPLANT
SYR 30ML LL (SYRINGE) ×6 IMPLANT
TIBIAL BASE ROT PLAT SZ 8 KNEE (Knees) ×3 IMPLANT
TIP FAN IRRIG PULSAVAC PLUS (DISPOSABLE) ×1 IMPLANT
TOWEL OR 17X26 4PK STRL BLUE (TOWEL DISPOSABLE) ×3 IMPLANT
TOWER CARTRIDGE SMART MIX (DISPOSABLE) ×3 IMPLANT
TRAY FOLEY MTR SLVR 16FR STAT (SET/KITS/TRAYS/PACK) ×3 IMPLANT
WRAPON POLAR PAD KNEE (MISCELLANEOUS) ×3

## 2018-02-18 NOTE — Progress Notes (Signed)
ANTICOAGULATION CONSULT NOTE - Initial Consult  Pharmacy Consult for Warfarin  Indication: atrial fibrillation  No Known Allergies  Patient Measurements: Height: 6\' 3"  (190.5 cm) Weight: 241 lb (109.3 kg) IBW/kg (Calculated) : 84.5 Heparin Dosing Weight:   Vital Signs: Temp: 97.5 F (36.4 C) (11/20 1653) Temp Source: Temporal (11/20 1017) BP: 117/86 (11/20 1653) Pulse Rate: 81 (11/20 1653)  Labs: Recent Labs    02/18/18 0951 02/18/18 0952  HGB  --  12.9*  HCT  --  40.3  PLT  --  210  LABPROT 13.3  --   INR 1.02  --     Estimated Creatinine Clearance: 90.5 mL/min (by C-G formula based on SCr of 1.13 mg/dL).   Medical History: Past Medical History:  Diagnosis Date  . Blood clot in vein 4 years ago   [facility]    . Gout   . Hypertension     Medications:  PTA Warfarin dose 3mg  qd  Assessment: INR and CBC currently pending.     Goal of Therapy:  INR 2-3 Monitor platelets by anticoagulation protocol: Yes   Plan:  Will resume pt home dose of 3mg  @ 1800 pending labs and reassess after AM INR  11/20:  Will reschedule warfarin dose for 11/21 AM since pt is s/p total knee replacement.   Timia Casselman D 02/18/2018,5:25 PM

## 2018-02-18 NOTE — Anesthesia Preprocedure Evaluation (Signed)
Anesthesia Evaluation  Patient identified by MRN, date of birth, ID band Patient awake    Reviewed: Allergy & Precautions, H&P , NPO status , Patient's Chart, lab work & pertinent test results  History of Anesthesia Complications Negative for: history of anesthetic complications  Airway Mallampati: II  TM Distance: >3 FB Neck ROM: full    Dental  (+) Chipped, Poor Dentition, Missing, Upper Dentures   Pulmonary neg shortness of breath,  PE          Cardiovascular Exercise Tolerance: Good hypertension,      Neuro/Psych negative neurological ROS  negative psych ROS   GI/Hepatic negative GI ROS, Neg liver ROS, neg GERD  ,  Endo/Other  negative endocrine ROS  Renal/GU      Musculoskeletal  (+) Arthritis ,   Abdominal   Peds  Hematology negative hematology ROS (+)   Anesthesia Other Findings Past Medical History: 4 years ago: Blood clot in vein     Comment:  [facility]   No date: Gout No date: Hypertension  Past Surgical History: No date: FRACTURE SURGERY     Comment:  wrist 08/23/2016: KNEE ARTHROSCOPY; Left     Comment:  Procedure: ARTHROSCOPY KNEE WITH PARTIAL MEDIAL               MENISECTOMY;  Surgeon: Donato HeinzHooten, James P, MD;  Location:               [facility] ORS;  Service: Orthopedics;  Laterality: Left;  BMI    Body Mass Index:  30.12 kg/m      Reproductive/Obstetrics negative OB ROS                             Anesthesia Physical Anesthesia Plan  ASA: III  Anesthesia Plan: General ETT   Post-op Pain Management:    Induction: Intravenous  PONV Risk Score and Plan: Ondansetron, Dexamethasone, Midazolam and Treatment may vary due to age or medical condition  Airway Management Planned: Oral ETT  Additional Equipment:   Intra-op Plan:   Post-operative Plan: Extubation in OR  Informed Consent: I have reviewed the patients History and  Physical, chart, labs and discussed the procedure including the risks, benefits and alternatives for the proposed anesthesia with the patient or authorized representative who has indicated his/her understanding and acceptance.   Dental Advisory Given  Plan Discussed with: Anesthesiologist, CRNA and Surgeon  Anesthesia Plan Comments: (Patient reports taking therapeutic lovenox last night at 2000 so plan for GA  Patient consented for risks of anesthesia including but not limited to:  - adverse reactions to medications - damage to teeth, lips or other oral mucosa - sore throat or hoarseness - Damage to heart, brain, lungs or loss of life  Patient voiced understanding.)        Anesthesia Quick Evaluation

## 2018-02-18 NOTE — NC FL2 (Signed)
[place] MEDICAID FL2 LEVEL OF CARE SCREENING TOOL     IDENTIFICATION  Patient Name: Clifford Farley Birthdate: 05-15-1955 Sex: male Admission Date (Current Location): 02/18/2018  [place] and [place] Number:  Chiropodist and Address:  [facility], [address] [place], [place] [address]      Provider Number: [id number]  Attending Physician Name and Address:  Donato Heinz, MD  Relative Name and Phone Number:       Current Level of Care: Hospital Recommended Level of Care: Skilled Nursing Facility Prior Approval Number:    Date Approved/Denied:   PASRR Number: [id number])  Discharge Plan: SNF    Current Diagnoses: Patient Active Problem List   Diagnosis Date Noted  . 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 Hypoxia 12/05/2017  . History of DVT (deep vein thrombosis) 12/05/2017  . Primary osteoarthritis of left knee 11/16/2017  . Malaise 12/24/2016  . Chronic gouty arthropathy without tophi 12/10/2016  . Tenosynovitis of right hand 12/10/2016  . Hyperglycemia 11/27/2016  . Protime monitoring 03/08/2015  . Pulmonary embolism (HCC) 12/07/2014  . Hypertension 09/30/2012    Orientation RESPIRATION BLADDER Height & Weight     Self, Time, Situation, Place  O2(2 Liters Oxygen. ) Continent Weight: 241 lb (109.3 kg) Height:  6\' 3"  (190.5 cm)  BEHAVIORAL SYMPTOMS/MOOD NEUROLOGICAL BOWEL NUTRITION STATUS      Continent Diet(Diet: Regular )  AMBULATORY STATUS COMMUNICATION OF NEEDS Skin   Extensive Assist Verbally Surgical wounds(Incision: Left Knee. )                       Personal Care Assistance Level of Assistance  Bathing, Feeding, Dressing Bathing Assistance: Limited assistance Feeding assistance: Independent Dressing Assistance: Limited assistance     Functional Limitations Info  Sight, Hearing, Speech Sight Info: Adequate Hearing Info: Adequate Speech Info: Adequate    SPECIAL CARE FACTORS FREQUENCY   PT (By licensed PT), OT (By licensed OT)     PT Frequency: (5) OT Frequency: (5)            Contractures      Additional Factors Info  Code Status, Allergies Code Status Info: (Full Code. ) Allergies Info: (No Known Allergies. )           Current Medications (02/18/2018):  This is the current hospital active medication list Current Facility-Administered Medications  Medication Dose Route Frequency Provider Last Rate Last Dose  . 0.9 %  sodium chloride infusion   Intravenous Continuous Hooten, Illene Labrador, MD      . acetaminophen (OFIRMEV) IV 1,000 mg  1,000 mg Intravenous Q6H Hooten, Illene Labrador, MD      . Melene Muller ON 02/19/2018] acetaminophen (TYLENOL) tablet 325-650 mg  325-650 mg Oral Q6H PRN Hooten, Illene Labrador, MD      . alum & mag hydroxide-simeth (MAALOX/MYLANTA) 200-200-20 MG/5ML suspension 30 mL  30 mL Oral Q4H PRN Hooten, Illene Labrador, MD      . amLODipine (NORVASC) tablet 5 mg  5 mg Oral Daily Hooten, Illene Labrador, MD      . bisacodyl (DULCOLAX) suppository 10 mg  10 mg Rectal Daily PRN Hooten, Illene Labrador, MD      . ceFAZolin (ANCEF) IVPB 2g/100 mL premix  2 g Intravenous Q6H Hooten, Illene Labrador, MD      . celecoxib (CELEBREX) capsule 200 mg  200 mg Oral BID Hooten, Illene Labrador, MD      . dexamethasone (DECADRON) 10 MG/ML injection           .  diphenhydrAMINE (BENADRYL) 12.5 MG/5ML elixir 12.5-25 mg  12.5-25 mg Oral Q4H PRN Hooten, Illene LabradorJames P, MD      . ferrous sulfate tablet 325 mg  325 mg Oral BID WC Hooten, Illene LabradorJames P, MD      . gabapentin (NEURONTIN) capsule 300 mg  300 mg Oral QHS Hooten, Illene LabradorJames P, MD      . hydrochlorothiazide (HYDRODIURIL) tablet 25 mg  25 mg Oral Daily Hooten, Illene LabradorJames P, MD      . HYDROmorphone (DILAUDID) injection 0.5-1 mg  0.5-1 mg Intravenous Q4H PRN Hooten, Illene LabradorJames P, MD      . magnesium hydroxide (MILK OF MAGNESIA) suspension 30 mL  30 mL Oral Daily Hooten, Illene LabradorJames P, MD      . menthol-cetylpyridinium (CEPACOL) lozenge 3 mg  1 lozenge Oral PRN Hooten, Illene LabradorJames P, MD       Or  .  phenol (CHLORASEPTIC) mouth spray 1 spray  1 spray Mouth/Throat PRN Hooten, Illene LabradorJames P, MD      . metoCLOPramide (REGLAN) tablet 5-10 mg  5-10 mg Oral Q8H PRN Hooten, Illene LabradorJames P, MD       Or  . metoCLOPramide (REGLAN) injection 5-10 mg  5-10 mg Intravenous Q8H PRN Hooten, Illene LabradorJames P, MD      . metoCLOPramide (REGLAN) tablet 10 mg  10 mg Oral TID AC & HS Hooten, Illene LabradorJames P, MD      . ondansetron (ZOFRAN) tablet 4 mg  4 mg Oral Q6H PRN Hooten, Illene LabradorJames P, MD       Or  . ondansetron (ZOFRAN) injection 4 mg  4 mg Intravenous Q6H PRN Hooten, Illene LabradorJames P, MD      . oxyCODONE (Oxy IR/ROXICODONE) immediate release tablet 10 mg  10 mg Oral Q4H PRN Hooten, Illene LabradorJames P, MD      . oxyCODONE (Oxy IR/ROXICODONE) immediate release tablet 5 mg  5 mg Oral Q4H PRN Hooten, Illene LabradorJames P, MD      . pantoprazole (PROTONIX) EC tablet 40 mg  40 mg Oral BID Hooten, Illene LabradorJames P, MD      . senna-docusate (Senokot-S) tablet 1 tablet  1 tablet Oral BID Hooten, Illene LabradorJames P, MD      . sodium phosphate (FLEET) 7-19 GM/118ML enema 1 enema  1 enema Rectal Once PRN Hooten, Illene LabradorJames P, MD      . traMADol Janean Sark(ULTRAM) tablet 50-100 mg  50-100 mg Oral Q4H PRN Donato HeinzHooten, James P, MD      . Melene Muller[START ON 02/19/2018] warfarin (COUMADIN) tablet 3 mg  3 mg Oral Once Albina BilletShanlever, Charles M, RPH      . Warfarin - Pharmacist Dosing Inpatient   Does not apply q1800 Albina BilletShanlever, Charles M, [facility]         Discharge Medications: Please see discharge summary for a list of discharge medications.  Relevant Imaging Results:  Relevant Lab Results:   Additional Information (SSN: [id number])  Naziah Portee, Darleen CrockerBailey M, LCSW

## 2018-02-18 NOTE — Anesthesia Procedure Notes (Signed)
Procedure Name: Intubation Date/Time: 02/18/2018 11:14 AM Performed by: Bernardo Heater, CRNA Pre-anesthesia Checklist: Patient identified, Emergency Drugs available, Suction available and Patient being monitored Patient Re-evaluated:Patient Re-evaluated prior to induction Oxygen Delivery Method: Circle system utilized Preoxygenation: Pre-oxygenation with 100% oxygen Induction Type: IV induction Laryngoscope Size: Mac and 4 Grade View: Grade I Tube size: 7.0 mm Placement Confirmation: ETT inserted through vocal cords under direct vision,  positive ETCO2 and breath sounds checked- equal and bilateral Secured at: 22 cm Tube secured with: Tape Dental Injury: Teeth and Oropharynx as per pre-operative assessment

## 2018-02-18 NOTE — Transfer of Care (Signed)
Immediate Anesthesia Transfer of Care Note  Patient: Clifford Farley  Procedure(s) Performed: COMPUTER ASSISTED TOTAL KNEE ARTHROPLASTY (Left Knee)  Patient Location: PACU  Anesthesia Type:General  Level of Consciousness: awake and patient cooperative  Airway & Oxygen Therapy: Patient Spontanous Breathing and Patient connected to nasal cannula oxygen  Post-op Assessment: Report given to RN and Post -op Vital signs reviewed and stable  Post vital signs: Reviewed and stable  Last Vitals:  Vitals Value Taken Time  BP    Temp    Pulse 85 02/18/2018  2:39 PM  Resp    SpO2 100 % 02/18/2018  2:39 PM  Vitals shown include unvalidated device data.  Last Pain:  Vitals:   02/18/18 1017  TempSrc: Temporal  PainSc:          Complications: No apparent anesthesia complications

## 2018-02-18 NOTE — Anesthesia Post-op Follow-up Note (Signed)
Anesthesia QCDR form completed.        

## 2018-02-18 NOTE — Consult Note (Signed)
ANTICOAGULATION CONSULT NOTE - Initial Consult  Pharmacy Consult for Warfarin Indication: VTE prophylaxis  No Known Allergies  Patient Measurements: Height: 6\' 3"  (190.5 cm) Weight: 241 lb (109.3 kg) IBW/kg (Calculated) : 84.5  Vital Signs: Temp: 97.1 F (36.2 C) (11/20 1439) Temp Source: Temporal (11/20 1017) BP: 124/83 (11/20 1454) Pulse Rate: 82 (11/20 1454)  Labs: No results for input(s): HGB, HCT, PLT, APTT, LABPROT, INR, HEPARINUNFRC, HEPRLOWMOCWT, CREATININE, CKTOTAL, CKMB, TROPONINI in the last 72 hours.  Estimated Creatinine Clearance: 90.5 mL/min (by C-G formula based on SCr of 1.13 mg/dL).   Medical History: Past Medical History:  Diagnosis Date  . Blood clot in vein 4 years ago   [facility]    . Gout   . Hypertension     Medications:  PTA Warfarin dose 3mg  qd  Assessment: INR and CBC currently pending.     Goal of Therapy:  INR 2-3 Monitor platelets by anticoagulation protocol: Yes   Plan:  Will resume pt home dose of 3mg  @ 1800 pending labs and reassess after AM INR  Albina Billetharles M Karuna Balducci, PharmD Clinical Pharmacist 02/18/2018 3:00 PM

## 2018-02-18 NOTE — Op Note (Signed)
OPERATIVE NOTE  DATE OF SURGERY:  02/18/2018  PATIENT NAME:  Clifford Farley   DOB: [birth year]  MRN: [id number]  PRE-OPERATIVE DIAGNOSIS: Degenerative arthrosis of the left knee, primary  POST-OPERATIVE DIAGNOSIS:  Same  PROCEDURE:  Left total knee arthroplasty using computer-assisted navigation  SURGEON:  Jena GaussJames P Laryn Venning, Jr. M.D.  ASSISTANT:  Van ClinesJon Wolfe, PA (present and scrubbed throughout the case, critical for assistance with exposure, retraction, instrumentation, and closure)  ANESTHESIA: general  ESTIMATED BLOOD LOSS: 50 mL  FLUIDS REPLACED: 1300 mL of crystalloid  TOURNIQUET TIME: 90 minutes  DRAINS: 2 medium Hemovac drains  SOFT TISSUE RELEASES: Anterior cruciate ligament, posterior cruciate ligament, deep medial collateral ligament, patellofemoral ligament  IMPLANTS UTILIZED: DePuy Attune size 8 posterior stabilized femoral component (cemented), size 8 rotating platform tibial component (cemented), 38 mm medialized dome patella (cemented), and a 5 mm stabilized rotating platform polyethylene insert.  INDICATIONS FOR SURGERY: Clifford Farley is a 62 y.o. year old male with a long history of progressive knee pain. X-rays demonstrated severe degenerative changes in tricompartmental fashion. The patient had not seen any significant improvement despite conservative nonsurgical intervention. After discussion of the risks and benefits of surgical intervention, the patient expressed understanding of the risks benefits and agree with plans for total knee arthroplasty.   The risks, benefits, and alternatives were discussed at length including but not limited to the risks of infection, bleeding, nerve injury, stiffness, blood clots, the need for revision surgery, cardiopulmonary complications, among others, and they were willing to proceed.  PROCEDURE IN DETAIL: The patient was brought into the operating room and, after adequate general anesthesia was achieved, a tourniquet was placed  on the patient's upper thigh. The patient's knee and leg were cleaned and prepped with alcohol and DuraPrep and draped in the usual sterile fashion. A "timeout" was performed as per usual protocol. The lower extremity was exsanguinated using an Esmarch, and the tourniquet was inflated to 300 mmHg. An anterior longitudinal incision was made followed by a standard mid vastus approach. The deep fibers of the medial collateral ligament were elevated in a subperiosteal fashion off of the medial flare of the tibia so as to maintain a continuous soft tissue sleeve. The patella was subluxed laterally and the patellofemoral ligament was incised. Inspection of the knee demonstrated severe degenerative changes with full-thickness loss of articular cartilage. Osteophytes were debrided using a rongeur. Anterior and posterior cruciate ligaments were excised. Two 4.0 mm Schanz pins were inserted in the femur and into the tibia for attachment of the array of trackers used for computer-assisted navigation. Hip center was identified using a circumduction technique. Distal landmarks were mapped using the computer. The distal femur and proximal tibia were mapped using the computer. The distal femoral cutting guide was positioned using computer-assisted navigation so as to achieve a 5 distal valgus cut. The femur was sized and it was felt that a size 8 femoral component was appropriate. A size 8 femoral cutting guide was positioned and the anterior cut was performed and verified using the computer. This was followed by completion of the posterior and chamfer cuts. Femoral cutting guide for the central box was then positioned in the center box cut was performed.  Attention was then directed to the proximal tibia. Medial and lateral menisci were excised. The extramedullary tibial cutting guide was positioned using computer-assisted navigation so as to achieve a 0 varus-valgus alignment and 3 posterior slope. The cut was performed and  verified using the computer. The proximal tibia was sized  and it was felt that a size 8 tibial tray was appropriate. Tibial and femoral trials were inserted followed by insertion of a 5 mm polyethylene insert. This allowed for excellent mediolateral soft tissue balancing both in flexion and in full extension. Finally, the patella was cut and prepared so as to accommodate a 38 mm medialized dome patella. A patella trial was placed and the knee was placed through a range of motion with excellent patellar tracking appreciated. The femoral trial was removed after debridement of posterior osteophytes. The central post-hole for the tibial component was reamed followed by insertion of a keel punch. Tibial trials were then removed. Cut surfaces of bone were irrigated with copious amounts of normal saline with antibiotic solution using pulsatile lavage and then suctioned dry. Polymethylmethacrylate cement was prepared in the usual fashion using a vacuum mixer. Cement was applied to the cut surface of the proximal tibia as well as along the undersurface of a size 8 rotating platform tibial component. Tibial component was positioned and impacted into place. Excess cement was removed using Personal assistant. Cement was then applied to the cut surfaces of the femur as well as along the posterior flanges of the size 8 femoral component. The femoral component was positioned and impacted into place. Excess cement was removed using Personal assistant. A 5 mm polyethylene trial was inserted and the knee was brought into full extension with steady axial compression applied. Finally, cement was applied to the backside of a 38 mm medialized dome patella and the patellar component was positioned and patellar clamp applied. Excess cement was removed using Personal assistant. After adequate curing of the cement, the tourniquet was deflated after a total tourniquet time of 90 minutes. Hemostasis was achieved using electrocautery. The knee was  irrigated with copious amounts of normal saline with antibiotic solution using pulsatile lavage and then suctioned dry. 20 mL of 1.3% Exparel and 60 mL of 0.25% Marcaine in 40 mL of normal saline was injected along the posterior capsule, medial and lateral gutters, and along the arthrotomy site. A 5 mm stabilized rotating platform polyethylene insert was inserted and the knee was placed through a range of motion with excellent mediolateral soft tissue balancing appreciated and excellent patellar tracking noted. 2 medium drains were placed in the wound bed and brought out through separate stab incisions. The medial parapatellar portion of the incision was reapproximated using interrupted sutures of #1 Vicryl. Subcutaneous tissue was approximated in layers using first #0 Vicryl followed #2-0 Vicryl. The skin was approximated with skin staples. A sterile dressing was applied.  The patient tolerated the procedure well and was transported to the recovery room in stable condition.    Clifford Farley., M.D.

## 2018-02-18 NOTE — H&P (Signed)
The patient has been re-examined, and the chart reviewed, and there have been no interval changes to the documented history and physical.    The risks, benefits, and alternatives have been discussed at length. The patient expressed understanding of the risks benefits and agreed with plans for surgical intervention.  Unique Sillas P. Shareese Macha, Jr. M.D.    

## 2018-02-18 NOTE — Progress Notes (Signed)
Attempted to do IS with patient but patient too lethargic to stay awake and do. Is left at bedside, will attempt setup again once patient is more awake.

## 2018-02-19 ENCOUNTER — Encounter: Payer: Self-pay | Admitting: Orthopedic Surgery

## 2018-02-19 LAB — PROTIME-INR
INR: 1.02
Prothrombin Time: 13.3 seconds (ref 11.4–15.2)

## 2018-02-19 MED ORDER — TRAMADOL HCL 50 MG PO TABS: 50.0000 mg | ORAL_TABLET | ORAL | 0 refills | Status: DC | PRN

## 2018-02-19 MED ORDER — OXYCODONE HCL 5 MG PO TABS: 5.0000 mg | ORAL_TABLET | ORAL | 0 refills | Status: DC | PRN

## 2018-02-19 NOTE — Care Management Note (Addendum)
Case Management Note  Patient Details  Name: Clifford Farley MRN: [id number] Date of Birth: 07/08/1955  Subjective/Objective:                  RNCM met with patient to discuss discharge planning needs.  He lives alone and plans to go to his mother's address for 10 days- [address] [place] [address] and then to his home address [address] [place] [address] [address] (will have to buzz in).  He will need a rolling walker and I expect PT/INR level checked for Coumadin levels.  He states he has had home O2 for ~5 years but can't recall name of agency. He states it is not working right.  He requests home health PT through [facility]. Patient uses [facility] [address] from medications.   Action/Plan: Home health agency list provided.  Referral to [facility] for PT and Coumadin labs.  Referral to Jason with [facility] for potential home O2 arrangement and rolling walker.  RN updated that O2 assessment will be needed. RNCM will reach out to PCP if he does. RNCM will follow.  Expected Discharge Date:                  Expected Discharge Plan:     In-House Referral:     Discharge planning Services  CM Consult  Post Acute Care Choice:  Durable Medical Equipment, Home Health Choice offered to:  Patient  DME Arranged:  Walker rolling, Oxygen DME Agency:  [facility]  HH Arranged:  PT HH Agency:  [facility] (formerly [facility])  Status of Service:  In process, will continue to follow  If discussed at Long Length of Stay Meetings, dates discussed:    Additional Comments:  Angela Johnson, RN 02/19/2018, 8:43 AM  

## 2018-02-19 NOTE — Progress Notes (Signed)
Physical Therapy Treatment Patient Details Name: Clifford Farley MRN: [id number] DOB: 1955-10-29 Today's Date: 02/19/2018    History of Present Illness 62 y/o male s/p L TKA 02/18/18.     PT Comments    Pt continues to do well, surpassing POD1 expectations.  Good quad control and SLRs, 100 ft of ambulation with consistent cadence and no buckling, great effort and quality of motion with exercises and generally progressing well.     Follow Up Recommendations  Home health PT     Equipment Recommendations  Rolling walker with 5" wheels    Recommendations for Other Services       Precautions / Restrictions Precautions Precautions: Fall;Knee Restrictions Weight Bearing Restrictions: Yes LLE Weight Bearing: Weight bearing as tolerated    Mobility  Bed Mobility Overal bed mobility: Independent Bed Mobility: Supine to Sit     Supine to sit: Modified independent (Device/Increase time)     General bed mobility comments: Pt able to get out of and back into bed w/o assist  Transfers Overall transfer level: Needs assistance Equipment used: Rolling walker (2 wheeled) Transfers: Sit to/from Stand Sit to Stand: Min guard         General transfer comment: Pt able to rise and control descent with bed in lowest setting.  VCs for sequencing and set up, no phyiscal assist  Ambulation/Gait Ambulation/Gait assistance: Supervision Gait Distance (Feet): 100 Feet Assistive device: Rolling walker (2 wheeled)       General Gait Details: Pt showed good confidence, no buckling and was able to quickly get into a consistent cadence with minimal fatigue and minimal increased pain.     Stairs             Wheelchair Mobility    Modified Rankin (Stroke Patients Only)       Balance Overall balance assessment: Modified Independent                                          Cognition Arousal/Alertness: Awake/alert Behavior During Therapy: WFL for tasks  assessed/performed Overall Cognitive Status: Within Functional Limits for tasks assessed                                        Exercises Total Joint Exercises Ankle Circles/Pumps: Strengthening;10 reps Quad Sets: Strengthening;10 reps Gluteal Sets: Strengthening;10 reps Short Arc Quad: Strengthening;15 reps Heel Slides: Strengthening;15 reps Hip ABduction/ADduction: Strengthening;15 reps Straight Leg Raises: Strengthening;10 reps Knee Flexion: PROM;5 reps Other Exercises Other Exercises: Pt educated in polar care and compression sock mgt including donning/doffing, wear schedule and positioning. Other Exercises: Pt educated in use of AE for LB dressing tasks Other Exercises: Pt educated in falls prevention strategies while completing ADL tasks.     General Comments General comments (skin integrity, edema, etc.): Pt stated feeling nauseous in middle of session and needed to get into bed from recliner. Sitting in recliner: O2 96%, HR 80; lying supine O2 95%, HR 87      Pertinent Vitals/Pain Pain Assessment: 0-10 Pain Score: 5  Pain Location: knee Pain Descriptors / Indicators: Dull Pain Intervention(s): Limited activity within patient's tolerance;Monitored during session;Repositioned;Ice applied    Home Living Family/patient expects to be discharged to:: Private residence Living Arrangements: Alone Available Help at Discharge: Family(Pt to live with mother for 10 days follwoing  surgery 24/7) Type of Home: House Home Access: Ramped entrance   Home Layout: One level Home Equipment: None      Prior Function Level of Independence: Independent      Comments: Independent in ADL/IADL tasks and mobility; driving; enjoys playing/watching sports   PT Goals (current goals can now be found in the care plan section) Acute Rehab PT Goals Patient Stated Goal: get back to being active Progress towards PT goals: Progressing toward goals    Frequency    BID       PT Plan Current plan remains appropriate    Co-evaluation              AM-PAC PT "6 Clicks" Daily Activity  Outcome Measure  Difficulty turning over in bed (including adjusting bedclothes, sheets and blankets)?: None Difficulty moving from lying on back to sitting on the side of the bed? : None Difficulty sitting down on and standing up from a chair with arms (e.g., wheelchair, bedside commode, etc,.)?: A Little Help needed moving to and from a bed to chair (including a wheelchair)?: None Help needed walking in hospital room?: None Help needed climbing 3-5 steps with a railing? : A Little 6 Click Score: 22    End of Session Equipment Utilized During Treatment: Gait belt Activity Tolerance: Patient tolerated treatment well Patient left: with bed alarm set;with call bell/phone within reach   PT Visit Diagnosis: Muscle weakness (generalized) (M62.81);Difficulty in walking, not elsewhere classified (R26.2)     Time: 1405-1450 PT Time Calculation (min) (ACUTE ONLY): 45 min  Charges:  $Gait Training: 8-22 mins $Therapeutic Exercise: 23-37 mins                     Malachi ProGalen R Cayla Wiegand, DPT 02/19/2018, 4:03 PM

## 2018-02-19 NOTE — Care Management (Addendum)
Message left for PCP office- Dr. Dareen PianoAnderson requesting overnight oximetry to [facility] as patient shared that his equipment was old and not working properly.  Surgeon updated of this need also.  Patient did not qualify for continuous supplemental O2 however it may be needed during sleep. Update: RNCM received notification from [facility] that patient's home O2 was arranged through charity services several years ago.  Patient will need to qualify for nocturnal O2 in order to receive new O2 equipment.

## 2018-02-19 NOTE — Progress Notes (Signed)
ANTICOAGULATION CONSULT NOTE - Initial Consult  Pharmacy Consult for Warfarin  Indication: atrial fibrillation  No Known Allergies  Patient Measurements: Height: 6\' 3"  (190.5 cm) Weight: 241 lb (109.3 kg) IBW/kg (Calculated) : 84.5 Heparin Dosing Weight:   Vital Signs: Temp: 97.9 F (36.6 C) (11/21 0742) Temp Source: Oral (11/21 0742) BP: 123/82 (11/21 0742) Pulse Rate: 79 (11/21 0742)  Labs: Recent Labs    02/18/18 0951 02/18/18 0952 02/19/18 0314  HGB  --  12.9*  --   HCT  --  40.3  --   PLT  --  210  --   LABPROT 13.3  --  13.3  INR 1.02  --  1.02    Estimated Creatinine Clearance: 90.5 mL/min (by C-G formula based on SCr of 1.13 mg/dL).   Medical History: Past Medical History:  Diagnosis Date  . Blood clot in vein 4 years ago   [facility]    . Gout   . Hypertension     Medications:  PTA Warfarin dose 3mg  qd  Assessment: INR and CBC currently pending.     Goal of Therapy:  INR 2-3 Monitor platelets by anticoagulation protocol: Yes   Plan:  Will reschedule warfarin dose for 11/21 AM since pt is s/p total knee replacement. INR remains 1.02. Hgb/Plt stable. Daily INR/CBC ordered.   Ronnald RampKishan S Enrique Manganaro, PharmD  Clinical Pharmacist 02/19/2018,10:27 AM

## 2018-02-19 NOTE — Evaluation (Signed)
Occupational Therapy Evaluation Patient Details Name: Clifford Farley MRN: [id number] DOB: 21-Nov-1955 Today's Date: 02/19/2018    History of Present Illness 62 y/o male s/p L TKA 02/18/18.   Clinical Impression   Pt seen for OT evaluation this date, POD#1 from above surgery. Pt was independent in all ADLs and mobility  prior to surgery. Pt will be living with mother for 10 days after surgery who is available 24/7 for assistance. Pt is eager to return to PLOF with less pain and improved safety and independence. Currently pt demonstrates impairments (see OT Problem List below)  requiring minimal assist for LB dressing and bathing while in seated position due to pain and limited AROM of L knee. Pt instructed in polar care mgt, falls prevention strategies, home/routines modifications, DME/AE for LB bathing and dressing tasks, and compression stocking mgt. Pt hesitatingly verbalized understanding but would benefit from further instruction with polar care and compression sock mgt and dressing techniques with or without assistive devices for dressing and bathing skills to support recall and carryover prior to discharge and ultimately to maximize safety, independence, and minimize falls risk and caregiver burden. Do not currently anticipate any OT needs following this hospitalization.      Follow Up Recommendations  No OT follow up    Equipment Recommendations  3 in 1 bedside commode;Tub/shower bench    Recommendations for Other Services       Precautions / Restrictions Precautions Precautions: Fall;Knee Restrictions Weight Bearing Restrictions: Yes LLE Weight Bearing: Weight bearing as tolerated      Mobility Bed Mobility Overal bed mobility: Modified Independent             General bed mobility comments: Pt able to get himself into supine in bed w/ extra time  Transfers Overall transfer level: Needs assistance Equipment used: Rolling walker (2 wheeled) Transfers: Sit to/from  Stand Sit to Stand: Min guard         General transfer comment:  pt able to rise with CGA  and maintains standing balance with walker    Balance Overall balance assessment: Modified Independent                                         ADL either performed or assessed with clinical judgement   ADL Overall ADL's : Needs assistance/impaired Eating/Feeding: Independent;Sitting   Grooming: Independent;Sitting   Upper Body Bathing: Supervision/ safety;Sitting   Lower Body Bathing: Minimal assistance;Sitting/lateral leans   Upper Body Dressing : Supervision/safety;Sitting   Lower Body Dressing: Minimal assistance;Sit to/from stand;With adaptive equipment   Toilet Transfer: Cueing for safety;RW;Ambulation;Min guard           Functional mobility during ADLs: Min guard;Rolling walker       Vision Baseline Vision/History: Wears glasses Wears Glasses: At all times Patient Visual Report: No change from baseline       Perception     Praxis      Pertinent Vitals/Pain Pain Assessment: 0-10 Pain Score: 5  Pain Location: knee Pain Descriptors / Indicators: Dull Pain Intervention(s): Limited activity within patient's tolerance;Monitored during session;Repositioned;Ice applied     Hand Dominance     Extremity/Trunk Assessment Upper Extremity Assessment Upper Extremity Assessment: Overall WFL for tasks assessed(5/5 grip, shoulders and bi/triceps)   Lower Extremity Assessment Lower Extremity Assessment: Defer to PT evaluation;LLE deficits/detail LLE Deficits / Details: expected post op deficiencies       Communication  Communication Communication: No difficulties   Cognition Arousal/Alertness: Awake/alert Behavior During Therapy: WFL for tasks assessed/performed Overall Cognitive Status: Within Functional Limits for tasks assessed                                     General Comments  Pt stated feeling nauseous in middle of session  and wanted to get into bed from recliner. Sitting in recliner: O2 96%, HR 80; lying supine O2 95%, HR 87    Exercises  Other Exercises Other Exercises: Pt educated in polar care and compression sock mgt including donning/doffing, wear schedule and positioning. Other Exercises: Pt educated in use of AE for LB dressing tasks Other Exercises: Pt educated in falls prevention strategies while completing ADL tasks.    Shoulder Instructions      Home Living Family/patient expects to be discharged to:: Private residence Living Arrangements: Alone Available Help at Discharge: Family(Pt to live with mother for 10 days follwoing surgery 24/7) Type of Home: House Home Access: Ramped entrance     Home Layout: One level     Bathroom Shower/Tub: Producer, television/film/video: Handicapped height     Home Equipment: None          Prior Functioning/Environment Level of Independence: Independent        Comments: Independent in ADL/IADL tasks and mobility; driving; enjoys playing/watching sports        OT Problem List: Decreased strength;Decreased knowledge of use of DME or AE;Decreased range of motion;Impaired balance (sitting and/or standing);Pain      OT Treatment/Interventions: Self-care/ADL training;Therapeutic exercise;Therapeutic activities;Patient/family education;DME and/or AE instruction;Balance training    OT Goals(Current goals can be found in the care plan section) Acute Rehab OT Goals Patient Stated Goal: get back to being active OT Goal Formulation: With patient Time For Goal Achievement: 03/05/18 Potential to Achieve Goals: Good ADL Goals Pt Will Perform Lower Body Dressing: with supervision;with adaptive equipment;sit to/from stand Additional ADL Goal #1: Pt will independently educate caregiver in polar care mgt including donning/doffing, wear schedule and positioning Additional ADL Goal #2: Pt will independently educate caregiver in compression sock mgt  including donning/doffing, wear schedule and positioning Additional ADL Goal #3: Pt will verbalize plan including at least 2 falls prevention strategies while completing ADl tasks.  OT Frequency: Min 1X/week   Barriers to D/C:            Co-evaluation              AM-PAC PT "6 Clicks" Daily Activity     Outcome Measure Help from another person eating meals?: None Help from another person taking care of personal grooming?: None Help from another person toileting, which includes using toliet, bedpan, or urinal?: A Little Help from another person bathing (including washing, rinsing, drying)?: A Little Help from another person to put on and taking off regular upper body clothing?: None Help from another person to put on and taking off regular lower body clothing?: A Little 6 Click Score: 21   End of Session Equipment Utilized During Treatment: Gait belt;Rolling walker  Activity Tolerance: Patient tolerated treatment well Patient left: in bed;with call bell/phone within reach;with bed alarm set;with SCD's reapplied(RN in room)  OT Visit Diagnosis: Other abnormalities of gait and mobility (R26.89);Pain Pain - Right/Left: Left Pain - part of body: Knee                Time:  [id number] OT Time Calculation (min): 40 min Charges:     Gypsy BalsamMarisa Keirsten Matuska OTS  02/19/2018, 1:10 PM

## 2018-02-19 NOTE — Progress Notes (Addendum)
   Subjective: 1 Day Post-Op Procedure(s) (LRB): COMPUTER ASSISTED TOTAL KNEE ARTHROPLASTY (Left) Patient reports pain as mild.   Patient is well, and has had no acute complaints or problems We will start therapy today.  Dangle leg at side of bed last night Plan is to go Home after hospital stay. no nausea and no vomiting Patient denies any chest pains or shortness of breath. Objective: Vital signs in last 24 hours: Temp:  [97.1 F (36.2 C)-99.7 F (37.6 C)] 98.2 F (36.8 C) (11/21 0417) Pulse Rate:  [77-86] 84 (11/21 0417) Resp:  [7-18] 14 (11/21 0417) BP: (107-132)/(76-93) 108/76 (11/21 0417) SpO2:  [87 %-100 %] 99 % (11/21 0417) Weight:  [109.3 kg] 109.3 kg (11/20 0954) Heels are non tender and elevated off the bed using rolled towels along with bone foam under operative heel Intake/Output from previous day: 11/20 0701 - 11/21 0700 In: 2313.6 [I.V.:2203.6; IV Piggyback:110] Out: 3397 [Urine:3157; Drains:190; Blood:50] Intake/Output this shift: No intake/output data recorded.  Recent Labs    02/18/18 0952  HGB 12.9*   Recent Labs    02/18/18 0952  WBC 3.6*  RBC 4.99  HCT 40.3  PLT 210   No results for input(s): NA, K, CL, CO2, BUN, CREATININE, GLUCOSE, CALCIUM in the last 72 hours. Recent Labs    02/18/18 0951 02/19/18 0314  INR 1.02 1.02    EXAM General - Patient is Alert, Appropriate and Oriented Extremity - Neurologically intact Neurovascular intact Sensation intact distally Intact pulses distally Dorsiflexion/Plantar flexion intact Compartment soft Dressing - dressing C/D/I Motor Function - intact, moving foot and toes well on exam.  Is able to do straight leg raise with mild assistance  Past Medical History:  Diagnosis Date  . Blood clot in vein 4 years ago   [facility]    . Gout   . Hypertension     Assessment/Plan: 1 Day Post-Op Procedure(s) (LRB): COMPUTER ASSISTED TOTAL KNEE ARTHROPLASTY  (Left) Active Problems:   Total knee replacement status  Estimated body mass index is 30.12 kg/m as calculated from the following:   Height as of this encounter: 6\' 3"  (1.905 m).   Weight as of this encounter: 109.3 kg. Advance diet Up with therapy D/C IV fluids Plan for discharge tomorrow Discharge home with home health  Labs: INR 1.02 DVT Prophylaxis - Coumadin, Foot Pumps and TED hose Weight-Bearing as tolerated to left leg D/C O2 and Pulse OX and try on Room Air Begin working on bowel movement  Rodney Yera R. [facility]Wolfe PA [facility] 02/19/2018, 7:14 AM

## 2018-02-19 NOTE — Anesthesia Postprocedure Evaluation (Signed)
Anesthesia Post Note  Patient: Clifford Farley  Procedure(s) Performed: COMPUTER ASSISTED TOTAL KNEE ARTHROPLASTY (Left Knee)  Patient location during evaluation: PACU Anesthesia Type: General Level of consciousness: awake and alert Pain management: pain level controlled Vital Signs Assessment: post-procedure vital signs reviewed and stable Respiratory status: spontaneous breathing, nonlabored ventilation, respiratory function stable and patient connected to nasal cannula oxygen Cardiovascular status: blood pressure returned to baseline and stable Postop Assessment: no apparent nausea or vomiting Anesthetic complications: no     Last Vitals:  Vitals:   02/18/18 2226 02/19/18 0417  BP: 124/84 108/76  Pulse: 86 84  Resp: 16 14  Temp: 37.1 C 36.8 C  SpO2: 100% 99%    Last Pain:  Vitals:   02/19/18 0417  TempSrc: Oral  PainSc:                  [name]

## 2018-02-19 NOTE — Progress Notes (Signed)
Clinical Social Worker (CSW) received SNF consult. PT is recommending home health. RN case manager aware of above. Please reconsult if future social work needs arise. CSW signing off.   Oneta Sigman, LCSW [phone] 

## 2018-02-19 NOTE — Evaluation (Signed)
Physical Therapy Evaluation Patient Details Name: Clifford Farley MRN: [id number] DOB: 1956-02-24 Today's Date: 02/19/2018   History of Present Illness  62 y/o male s/p L TKA 02/18/18.  Clinical Impression  Pt showed good effort t/o PT session and generally did very well with strength, ROM, ambulation, mobility etc for first PT session post-op.  He walked slowly but confidently into the hallway, had >80 of flexion, got to sitting and standing w/o direct assist and was easily able to do SLRs.  He has achieved POD1 goals and was not overly pain limited with mobility, etc.     Follow Up Recommendations Home health PT    Equipment Recommendations  Rolling walker with 5" wheels    Recommendations for Other Services       Precautions / Restrictions Precautions Precautions: Fall;Knee Restrictions LLE Weight Bearing: Weight bearing as tolerated      Mobility  Bed Mobility Overal bed mobility: Independent             General bed mobility comments: Pt able to get himself to sitting at EOB w/o direct assist  Transfers Overall transfer level: Modified independent Equipment used: Rolling walker (2 wheeled)             General transfer comment: raised bed ~3" to aid with getting to standing, pt able to rise without direct assist and maintain standing balance with walker  Ambulation/Gait Ambulation/Gait assistance: Supervision Gait Distance (Feet): 40 Feet Assistive device: Rolling walker (2 wheeled)       General Gait Details: Pt did well with ambulation showing good effort, safety and on room air maintained O2 95% or greater.   Stairs            Wheelchair Mobility    Modified Rankin (Stroke Patients Only)       Balance Overall balance assessment: Modified Independent                                           Pertinent Vitals/Pain Pain Assessment: 0-10 Pain Score: 5  Pain Location: knee    Home Living Family/patient expects to be  discharged to:: Private residence   Available Help at Discharge: Family(plans to stay with his mother initially)   Home Access: Ramped entrance(also has steps)     Home Layout: One level Home Equipment: None      Prior Function Level of Independence: Independent         Comments: Pt normally able to be active, drive, etc     Hand Dominance        Extremity/Trunk Assessment   Upper Extremity Assessment Upper Extremity Assessment: Overall WFL for tasks assessed    Lower Extremity Assessment Lower Extremity Assessment: Overall WFL for tasks assessed(expected post-op weakness in L LE)       Communication   Communication: No difficulties  Cognition Arousal/Alertness: Awake/alert Behavior During Therapy: WFL for tasks assessed/performed Overall Cognitive Status: Within Functional Limits for tasks assessed                                        General Comments      Exercises Total Joint Exercises Ankle Circles/Pumps: Strengthening;10 reps Quad Sets: Strengthening;10 reps Gluteal Sets: Strengthening;10 reps Short Arc Quad: Strengthening;10 reps Heel Slides: Strengthening;10 reps Hip ABduction/ADduction: Strengthening;10 reps Straight  Leg Raises: AROM;10 reps Knee Flexion: PROM;5 reps Goniometric ROM: 0-81   Assessment/Plan    PT Assessment Patient needs continued PT services  PT Problem List Decreased strength;Decreased range of motion;Decreased activity tolerance;Decreased balance;Decreased mobility;Decreased coordination;Decreased knowledge of use of DME;Decreased safety awareness;Pain       PT Treatment Interventions DME instruction;Gait training;Stair training;Functional mobility training;Therapeutic activities;Therapeutic exercise;Neuromuscular re-education;Balance training;Patient/family education    PT Goals (Current goals can be found in the Care Plan section)  Acute Rehab PT Goals Patient Stated Goal: get back to being active PT  Goal Formulation: With patient Time For Goal Achievement: 03/05/18 Potential to Achieve Goals: Good    Frequency BID   Barriers to discharge        Co-evaluation               AM-PAC PT "6 Clicks" Daily Activity  Outcome Measure Difficulty turning over in bed (including adjusting bedclothes, sheets and blankets)?: None Difficulty moving from lying on back to sitting on the side of the bed? : A Little Difficulty sitting down on and standing up from a chair with arms (e.g., wheelchair, bedside commode, etc,.)?: A Little Help needed moving to and from a bed to chair (including a wheelchair)?: None Help needed walking in hospital room?: A Little Help needed climbing 3-5 steps with a railing? : A Little 6 Click Score: 20    End of Session Equipment Utilized During Treatment: Gait belt Activity Tolerance: Patient tolerated treatment well Patient left: with bed alarm set;with call bell/phone within reach Nurse Communication: Mobility status PT Visit Diagnosis: Muscle weakness (generalized) (M62.81);Difficulty in walking, not elsewhere classified (R26.2)    Time: 1610-9604 PT Time Calculation (min) (ACUTE ONLY): 34 min   Charges:   PT Evaluation $PT Eval Low Complexity: 1 Low PT Treatments $Therapeutic Exercise: 8-22 mins        Malachi Pro, DPT 02/19/2018, 11:50 AM

## 2018-02-19 NOTE — Progress Notes (Signed)
Tolerated sitting on side of bed.

## 2018-02-19 NOTE — Discharge Summary (Signed)
Physician Discharge Summary  Patient ID: Clifford Farley MRN: [id number] DOB/AGE: 09/07/1955 62 y.o.  Admit date: 02/18/2018 Discharge date: 02/20/2018  Admission Diagnoses:  PRIMARY OSTEOARTHRITIS OF LEFT KNEE   Discharge Diagnoses: Patient Active Problem List   Diagnosis Date Noted  . 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 Hypoxia 12/05/2017  . History of DVT (deep vein thrombosis) 12/05/2017  . Primary osteoarthritis of left knee 11/16/2017  . Malaise 12/24/2016  . Chronic gouty arthropathy without tophi 12/10/2016  . Tenosynovitis of right hand 12/10/2016  . Hyperglycemia 11/27/2016  . Protime monitoring 03/08/2015  . Pulmonary embolism (HCC) 12/07/2014  . Hypertension 09/30/2012    Past Medical History:  Diagnosis Date  . Blood clot in vein 4 years ago   [facility].    . Gout   . Hypertension      Transfusion: No transfusions during this admission   Consultants (if any):   Discharged Condition: Improved  Hospital Course: Clifford Farley is an 62 y.o. male who was admitted 02/18/2018 with a diagnosis of degenerative arthrosis left knee and went to the operating room on 02/18/2018 and underwent the above named procedures.    Surgeries:Procedure(s): COMPUTER ASSISTED TOTAL KNEE ARTHROPLASTY on 02/18/2018  *PRE-OPERATIVE DIAGNOSIS: Degenerative arthrosis of the left knee, primary  POST-OPERATIVE DIAGNOSIS:  Same  PROCEDURE:  Left total knee arthroplasty using computer-assisted navigation  SURGEON:  Jena GaussJames P Hooten, Jr. M.D.  ASSISTANT:  Van ClinesJon Thera Basden, PA (present and scrubbed throughout the case, critical for assistance with exposure, retraction, instrumentation, and closure)  ANESTHESIA: general  ESTIMATED BLOOD LOSS: 50 mL  FLUIDS REPLACED: 1300 mL of crystalloid  TOURNIQUET TIME: 90 minutes  DRAINS: 2 medium Hemovac drains  SOFT TISSUE RELEASES: Anterior cruciate ligament, posterior cruciate ligament,  deep medial collateral ligament, patellofemoral ligament  IMPLANTS UTILIZED: DePuy Attune size 8 posterior stabilized femoral component (cemented), size 8 rotating platform tibial component (cemented), 38 mm medialized dome patella (cemented), and a 5 mm stabilized rotating platform polyethylene insert.  INDICATIONS FOR SURGERY: Clifford Farley is a 62 y.o. year old male with a long history of progressive knee pain. X-rays demonstrated severe degenerative changes in tricompartmental fashion. The patient had not seen any significant improvement despite conservative nonsurgical intervention. After discussion of the risks and benefits of surgical intervention, the patient expressed understanding of the risks benefits and agree with plans for total knee arthroplasty.   The risks, benefits, and alternatives were discussed at length including but not limited to the risks of infection, bleeding, nerve injury, stiffness, blood clots, the need for revision surgery, cardiopulmonary complications, among others, and they were willing to proceed.** Patient tolerated the surgery well. No complications .Patient was taken to PACU where she was stabilized and then transferred to the orthopedic floor.  Patient started on warfarin which is what he was on prior to admission. Foot pumps applied bilaterally at 80 mm hgb. Heels elevated off bed with rolled towels. No evidence of DVT. Calves non tender. Negative Homan. Physical therapy started on day #1 for gait training and transfer with OT starting on  day #1 for ADL and assisted devices. Patient has done well with therapy. Ambulated greater than 200 feet upon being discharged.  Patient's IV And Foley were discontinued on day #1 with Hemovac being discontinued on day #2. Dressing was changed on day 2 prior to patient being discharged   He was given perioperative antibiotics:  Anti-infectives (From admission, onward)   Start     Dose/Rate  Route Frequency Ordered Stop    02/18/18 1730  ceFAZolin (ANCEF) IVPB 2g/100 mL premix     2 g 200 mL/hr over 30 Minutes Intravenous Every 6 hours 02/18/18 1709 02/19/18 1729   02/18/18 0854  ceFAZolin (ANCEF) 2-4 GM/100ML-% IVPB    Note to Pharmacy:  Clifford Farley: cabinet override      02/18/18 0854 02/18/18 1125   02/18/18 0600  ceFAZolin (ANCEF) IVPB 2g/100 mL premix     2 g 200 mL/hr over 30 Minutes Intravenous On call to O.R. 02/17/18 2237 02/18/18 1137    .  He was fitted with AV 1 compression foot pump devices, instructed on heel pumps, early ambulation, and fitted with TED stockings bilaterally for DVT prophylaxis.  He benefited maximally from the hospital stay and there were no complications.    Recent vital signs:  Vitals:   02/18/18 2226 02/19/18 0417  BP: 124/84 108/76  Pulse: 86 84  Resp: 16 14  Temp: 98.8 F (37.1 C) 98.2 F (36.8 C)  SpO2: 100% 99%    Recent laboratory studies:  Lab Results  Component Value Date   HGB 12.9 (L) 02/18/2018   HGB 13.3 02/04/2018   HGB 13.4 12/06/2017   Lab Results  Component Value Date   WBC 3.6 (L) 02/18/2018   PLT 210 02/18/2018   Lab Results  Component Value Date   INR 1.02 02/19/2018   Lab Results  Component Value Date   NA 140 02/04/2018   K 3.2 (L) 02/04/2018   CL 101 02/04/2018   CO2 30 02/04/2018   BUN 13 02/04/2018   CREATININE 1.13 02/04/2018   GLUCOSE 123 (H) 02/04/2018    Discharge Medications:   Allergies as of 02/19/2018   No Known Allergies     Medication List    TAKE these medications   amLODipine 5 MG tablet Commonly known as:  NORVASC Take 1 tablet (5 mg total) by mouth daily. What changed:  additional instructions   hydrochlorothiazide 25 MG tablet Commonly known as:  HYDRODIURIL Take 1 tablet (25 mg total) by mouth daily.   oxyCODONE 5 MG immediate release tablet Commonly known as:  Oxy IR/ROXICODONE Take 1 tablet (5 mg total) by mouth every 4 (four) hours as needed for moderate pain (pain score  4-6).   traMADol 50 MG tablet Commonly known as:  ULTRAM Take 1-2 tablets (50-100 mg total) by mouth every 4 (four) hours as needed for moderate pain.   warfarin 3 MG tablet Commonly known as:  COUMADIN Take 3 mg by mouth daily. In the evening            Durable Medical Equipment  (From admission, onward)         Start     Ordered   02/18/18 1710  DME Walker rolling  Once    Question:  Patient needs a walker to treat with the following condition  Answer:  Total knee replacement status   02/18/18 1709   02/18/18 1710  DME Bedside commode  Once    Question:  Patient needs a bedside commode to treat with the following condition  Answer:  Total knee replacement status   02/18/18 1709          Diagnostic Studies: Dg Knee Left Port  Result Date: 02/18/2018 CLINICAL DATA:  Left knee replacement. EXAM: PORTABLE LEFT KNEE - 1-2 VIEW COMPARISON:  MRI left knee dated July 22, 2016. FINDINGS: The left knee demonstrates a total knee arthroplasty without evidence of  hardware failure or complication. There is expected intra-articular air. There is no fracture or dislocation. The alignment is anatomic. Surgical drains are present. Post-surgical changes noted in the surrounding soft tissues. IMPRESSION: Interval left total knee arthroplasty without evidence of acute postoperative complication. Electronically Signed   By: Obie Dredge M.D.   On: 02/18/2018 15:05    Disposition:   Discharge Instructions    Increase activity slowly   Complete by:  As directed       Follow-up Information    Tera Partridge, PA On 03/05/2018.   Specialty:  Physician Assistant Why:  at 1:15pm Contact information: [address] [facility] [place] [address] [phone]        Donato Heinz, MD On 04/07/2018.   Specialty:  Orthopedic Surgery Why:  at 9:15am Contact information: [address] [facility] [place] [address] [phone]             Signed: Tera Partridge 02/19/2018, 7:24 AM

## 2018-02-20 LAB — CBC
HCT: 28.6 % — ABNORMAL LOW (ref 39.0–52.0)
HEMOGLOBIN: 9.1 g/dL — AB (ref 13.0–17.0)
MCH: 25.7 pg — AB (ref 26.0–34.0)
MCHC: 31.8 g/dL (ref 30.0–36.0)
MCV: 80.8 fL (ref 80.0–100.0)
NRBC: 0 % (ref 0.0–0.2)
PLATELETS: 173 10*3/uL (ref 150–400)
RBC: 3.54 MIL/uL — AB (ref 4.22–5.81)
RDW: 15.6 % — ABNORMAL HIGH (ref 11.5–15.5)
WBC: 4.1 10*3/uL (ref 4.0–10.5)

## 2018-02-20 LAB — PROTIME-INR
INR: 1.01
PROTHROMBIN TIME: 13.2 s (ref 11.4–15.2)

## 2018-02-20 MED ORDER — WARFARIN SODIUM 3 MG PO TABS
3.0000 mg | ORAL_TABLET | Freq: Once | ORAL | Status: DC
  Filled 2018-02-20: qty 1

## 2018-02-20 NOTE — Care Management (Signed)
Discussed overnight oximetry with [facility]. Patient desatted for 339 seconds during 5 hours over night oximetry.   Patient did have nocturnal oxygen with [facility] in 2012 and [facility] informed CM that guidelines in 2012 did not required to have a documented chronic diagnosis. CM reviewed entire medical record/encounters/care everywhere and there is no documentation of a chronic diagnosis that would qualify him for home oxygen that would be covered by his insurance. Patient declines paying privately.  Says he uses the oxygen in his home "sometimes when I think I need it."  Patient has not required continuous oxygen during this stay.   CM left message with patient's pcp office Dr Durenda AgeAndersen and had an appointment made for Monday Dec 25.  Patient says "that ain't going to happen. My sister has too much to do."  Instructed patient to contact off to reschedule for a more convenient time.  Walker and bedside commode in the room.  Notified [facility] of discharge. Home health orders present.  The overnight oximetry results expire in 48 hours

## 2018-02-20 NOTE — Progress Notes (Signed)
ANTICOAGULATION CONSULT NOTE - Initial Consult  Pharmacy Consult for Warfarin  Indication: atrial fibrillation  No Known Allergies  Patient Measurements: Height: 6\' 3"  (190.5 cm) Weight: 241 lb (109.3 kg) IBW/kg (Calculated) : 84.5 Heparin Dosing Weight:   Vital Signs: Temp: 97.9 F (36.6 C) (11/22 0834) Temp Source: Oral (11/22 0834) BP: 109/83 (11/22 0834) Pulse Rate: 75 (11/22 0834)  Labs: Recent Labs    02/18/18 0951 02/18/18 0952 02/19/18 0314 02/20/18 0503  HGB  --  12.9*  --  9.1*  HCT  --  40.3  --  28.6*  PLT  --  210  --  173  LABPROT 13.3  --  13.3 13.2  INR 1.02  --  1.02 1.01    Estimated Creatinine Clearance: 90.5 mL/min (by C-G formula based on SCr of 1.13 mg/dL).   Medical History: Past Medical History:  Diagnosis Date  . Blood clot in vein 4 years ago   [facility]    . Gout   . Hypertension     Medications:  PTA Warfarin dose 3mg  qd  Assessment:  Hemoglobin dropped 12.9 >> 9.1 Plt 210>> 173. S/p TKA 11/20.   Date INR Warfarin Dose  11/21 1.02 3 mg  11/22 1.01 None - pt is being discharged     Goal of Therapy:  INR 2-3 Monitor platelets by anticoagulation protocol: Yes   Plan:  Spoke to Cletis AthensJon, he did not see any bleeding and the plan is for the patient to go home today. Will not order a warfarin dose today and will allow patient to restart medication once he is discharged.   Ronnald RampKishan S Keshawn Sundberg, PharmD  Clinical Pharmacist 02/20/2018,1:08 PM

## 2018-02-20 NOTE — Progress Notes (Signed)
   Subjective: 2 Days Post-Op Procedure(s) (LRB): COMPUTER ASSISTED TOTAL KNEE ARTHROPLASTY (Left) Patient reports pain as mild.   Patient is well, and has had no acute complaints or problems Did well with therapy yesterday.  Was able to ambulate 100 feet.  Still needs to do steps to have a bowel movement prior to being discharged Plan is to go Home after hospital stay. no nausea and no vomiting Patient denies any chest pains or shortness of breath. Objective: Vital signs in last 24 hours: Temp:  [97.5 F (36.4 C)-98.4 F (36.9 C)] 97.8 F (36.6 C) (11/22 0014) Pulse Rate:  [78-97] 78 (11/22 0014) Resp:  [19] 19 (11/22 0014) BP: (98-124)/(65-82) 98/65 (11/22 0014) SpO2:  [92 %-98 %] 92 % (11/22 0014) well approximated incision Heels are non tender and elevated off the bed using rolled towels Intake/Output from previous day: 11/21 0701 - 11/22 0700 In: 340 [P.O.:240; IV Piggyback:100] Out: 1360 [Urine:1250; Drains:110] Intake/Output this shift: No intake/output data recorded.  Recent Labs    02/18/18 0952 02/20/18 0503  HGB 12.9* 9.1*   Recent Labs    02/18/18 0952 02/20/18 0503  WBC 3.6* 4.1  RBC 4.99 3.54*  HCT 40.3 28.6*  PLT 210 173   No results for input(s): NA, K, CL, CO2, BUN, CREATININE, GLUCOSE, CALCIUM in the last 72 hours. Recent Labs    02/19/18 0314 02/20/18 0503  INR 1.02 1.01    EXAM General - Patient is Alert, Appropriate and Oriented Extremity - Neurologically intact Neurovascular intact Sensation intact distally Intact pulses distally Dorsiflexion/Plantar flexion intact No cellulitis present Compartment soft Dressing - dressing C/D/I Motor Function - intact, moving foot and toes well on exam.    Past Medical History:  Diagnosis Date  . Blood clot in vein 4 years ago   [facility]    . Gout   . Hypertension     Assessment/Plan: 2 Days Post-Op Procedure(s) (LRB): COMPUTER ASSISTED TOTAL KNEE  ARTHROPLASTY (Left) Active Problems:   Total knee replacement status  Estimated body mass index is 30.12 kg/m as calculated from the following:   Height as of this encounter: 6\' 3"  (1.905 m).   Weight as of this encounter: 109.3 kg. Up with therapy Discharge home with home health  Labs: INR 1.3 DVT Prophylaxis - Coumadin, Foot Pumps and TED hose Weight-Bearing as tolerated to left leg Hemovac was discontinued today.  Into the drain appeared to be intact. Please wash operative leg, change dressing and apply TED stockings to both legs Please give patient 2 extra honeycomb dressings to take home Patient needs a bowel movement prior to being discharged  Brolin Dambrosia R. [facility] PA [facility] 02/20/2018, 7:14 AM

## 2018-02-20 NOTE — Progress Notes (Signed)
ANTICOAGULATION CONSULT NOTE - Initial Consult  Pharmacy Consult for Warfarin  Indication: atrial fibrillation  No Known Allergies  Patient Measurements: Height: 6\' 3"  (190.5 cm) Weight: 241 lb (109.3 kg) IBW/kg (Calculated) : 84.5 Heparin Dosing Weight:   Vital Signs: Temp: 97.9 F (36.6 C) (11/22 0834) Temp Source: Oral (11/22 0834) BP: 109/83 (11/22 0834) Pulse Rate: 75 (11/22 0834)  Labs: Recent Labs    02/18/18 0951 02/18/18 0952 02/19/18 0314 02/20/18 0503  HGB  --  12.9*  --  9.1*  HCT  --  40.3  --  28.6*  PLT  --  210  --  173  LABPROT 13.3  --  13.3 13.2  INR 1.02  --  1.02 1.01    Estimated Creatinine Clearance: 90.5 mL/min (by C-G formula based on SCr of 1.13 mg/dL).   Medical History: Past Medical History:  Diagnosis Date  . Blood clot in vein 4 years ago   [facility]    . Gout   . Hypertension     Medications:  PTA Warfarin dose 3mg  qd  Assessment:  Hemoglobin dropped 12.9 >> 9.1 Plt 210>> 173. S/p TKA 11/20.   Date INR Warfarin Dose  11/21 1.02 3 mg  11/22 1.01 None - Waiting for team input.     Goal of Therapy:  INR 2-3 Monitor platelets by anticoagulation protocol: Yes   Plan:  Will discuss plan to continue warfarin with the drop in Hgb with team. If no bleeding noted and plan to continue. Will note another dose of 3 mg tonight. It will take ~ 5 days to reach steady state. Daily INR/CBC ordered.   Ronnald RampKishan S Leonila Speranza, PharmD  Clinical Pharmacist 02/20/2018,11:49 AM

## 2018-02-20 NOTE — Progress Notes (Signed)
OT Cancellation Note  Patient Details Name: Talmage NapCurnel Elena MRN: [id number] DOB: Aug 01, 1955   Cancelled Treatment:    Reason Eval/Treat Not Completed: Patient declined, no reason specified(Pt. reports having no further OT related needs, questions, concerns at this time. Pt. reports he is anticipating discharging home soon. Will complete the OT order, and sign off. )  Olegario MessierElaine Zeno Hickel, MS, OTR/L 02/20/2018, 12:03 PM

## 2018-02-20 NOTE — Progress Notes (Signed)
Physical Therapy Treatment Patient Details Name: Clifford Farley MRN: [id number] DOB: [birth year] Today's Date: 02/20/2018    History of Present Illness 62 y/o male s/p L TKA 02/18/18.     PT Comments    Pt is able to ambulate around the nurses' station with good confidence and consistent speed; also able to negotiate up/down steps w/o issue.  He showed good strength with exercises and had >100 degrees of flexion with PROM.  Overall pt had done quite well and should be safe to d/c, will be staying with mother to assist initially.    Follow Up Recommendations  Home health PT     Equipment Recommendations  Rolling walker with 5" wheels;3in1 (PT)    Recommendations for Other Services       Precautions / Restrictions Precautions Precautions: Fall;Knee Restrictions LLE Weight Bearing: Weight bearing as tolerated    Mobility  Bed Mobility Overal bed mobility: Independent Bed Mobility: Supine to Sit     Supine to sit: Modified independent (Device/Increase time)     General bed mobility comments: Pt did well transitioning to EOB, no assist needed, no hesitation  Transfers Overall transfer level: Modified independent Equipment used: Rolling walker (2 wheeled) Transfers: Sit to/from Stand Sit to Stand: Supervision         General transfer comment: Pt able to rise from low bed setting w/o assist, good effort and confidence.   Ambulation/Gait Ambulation/Gait assistance: Modified independent (Device/Increase time) Gait Distance (Feet): 250 Feet Assistive device: Rolling walker (2 wheeled)       General Gait Details: Pt able to quickly assume appropriate and consistent ambulation, he had no limp and though he was reliant on the walker did not needed excessive weight through UEs.     Stairs Stairs: Yes Stairs assistance: Modified independent (Device/Increase time) Stair Management: Two rails Number of Stairs: 4 General stair comments: Pt able to negotiate up/down  steps w/o issue.  Showed good confidence and no safety concerns.    Wheelchair Mobility    Modified Rankin (Stroke Patients Only)       Balance Overall balance assessment: Modified Independent                                          Cognition Arousal/Alertness: Awake/alert Behavior During Therapy: WFL for tasks assessed/performed Overall Cognitive Status: Within Functional Limits for tasks assessed                                        Exercises Total Joint Exercises Ankle Circles/Pumps: Strengthening;10 reps Quad Sets: Strengthening;10 reps Gluteal Sets: Strengthening;10 reps Short Arc Quad: Strengthening;15 reps Heel Slides: Strengthening;15 reps Hip ABduction/ADduction: Strengthening;15 reps Straight Leg Raises: Strengthening;10 reps Knee Flexion: PROM;5 reps Goniometric ROM: 0-102    General Comments        Pertinent Vitals/Pain Pain Assessment: 0-10 Pain Score: 4  Pain Location: knee    Home Living                      Prior Function            PT Goals (current goals can now be found in the care plan section) Progress towards PT goals: Progressing toward goals    Frequency    BID      PT Plan  Current plan remains appropriate    Co-evaluation              AM-PAC PT "6 Clicks" Daily Activity  Outcome Measure  Difficulty turning over in bed (including adjusting bedclothes, sheets and blankets)?: None Difficulty moving from lying on back to sitting on the side of the bed? : None Difficulty sitting down on and standing up from a chair with arms (e.g., wheelchair, bedside commode, etc,.)?: None Help needed moving to and from a bed to chair (including a wheelchair)?: None Help needed walking in hospital room?: None Help needed climbing 3-5 steps with a railing? : None 6 Click Score: 24    End of Session Equipment Utilized During Treatment: Gait belt Activity Tolerance: Patient tolerated  treatment well Patient left: with call bell/phone within reach;with chair alarm set Nurse Communication: Mobility status PT Visit Diagnosis: Muscle weakness (generalized) (M62.81);Difficulty in walking, not elsewhere classified (R26.2)     Time: [id number] PT Time Calculation (min) (ACUTE ONLY): 32 min  Charges:  $Gait Training: 8-22 mins $Therapeutic Exercise: 8-22 mins                     Malachi ProGalen R Deavion Strider, DPT 02/20/2018, 12:13 PM

## 2018-02-20 NOTE — Progress Notes (Signed)
Discharge summary reviewed with verbal understanding. 2 Rxs given to Pt

## 2018-04-08 DIAGNOSIS — Z96652 Presence of left artificial knee joint: Secondary | ICD-10-CM | POA: Insufficient documentation

## 2018-09-01 DIAGNOSIS — N522 Drug-induced erectile dysfunction: Secondary | ICD-10-CM | POA: Insufficient documentation

## 2018-12-15 ENCOUNTER — Emergency Department: Payer: Medicare Other

## 2018-12-15 ENCOUNTER — Encounter: Payer: Self-pay | Admitting: Emergency Medicine

## 2018-12-15 ENCOUNTER — Emergency Department
Admission: EM | Admit: 2018-12-15 | Discharge: 2018-12-15 | Disposition: A | Discharge status: Home / Self Care | Payer: Medicare Other | Attending: Emergency Medicine | Admitting: Emergency Medicine

## 2018-12-15 DIAGNOSIS — Z79899 Other long term (current) drug therapy: Secondary | ICD-10-CM | POA: Diagnosis not present

## 2018-12-15 DIAGNOSIS — M25062 Hemarthrosis, left knee: Secondary | ICD-10-CM | POA: Diagnosis not present

## 2018-12-15 DIAGNOSIS — Z96652 Presence of left artificial knee joint: Secondary | ICD-10-CM | POA: Insufficient documentation

## 2018-12-15 DIAGNOSIS — R791 Abnormal coagulation profile: Secondary | ICD-10-CM | POA: Insufficient documentation

## 2018-12-15 DIAGNOSIS — M25562 Pain in left knee: Secondary | ICD-10-CM | POA: Diagnosis present

## 2018-12-15 DIAGNOSIS — Z7901 Long term (current) use of anticoagulants: Secondary | ICD-10-CM | POA: Insufficient documentation

## 2018-12-15 DIAGNOSIS — I10 Essential (primary) hypertension: Secondary | ICD-10-CM | POA: Diagnosis not present

## 2018-12-15 LAB — BASIC METABOLIC PANEL
Anion gap: 11 (ref 5–15)
BUN: 22 mg/dL (ref 8–23)
CO2: 25 mmol/L (ref 22–32)
Calcium: 8.6 mg/dL — ABNORMAL LOW (ref 8.9–10.3)
Chloride: 102 mmol/L (ref 98–111)
Creatinine, Ser: 1.42 mg/dL — ABNORMAL HIGH (ref 0.61–1.24)
GFR calc Af Amer: >60 mL/min (ref >60)
GFR calc non Af Amer: 52 mL/min — ABNORMAL LOW (ref >60)
Glucose, Bld: 145 mg/dL — ABNORMAL HIGH (ref 70–99)
Potassium: 3.5 mmol/L (ref 3.5–5.1)
Sodium: 138 mmol/L (ref 135–145)

## 2018-12-15 LAB — CBC WITH DIFFERENTIAL/PLATELET
Abs Immature Granulocytes: 0.05 10*3/uL (ref 0.00–0.07)
Basophils Absolute: 0 10*3/uL (ref 0.0–0.1)
Basophils Relative: 0 %
Eosinophils Absolute: 0.1 10*3/uL (ref 0.0–0.5)
Eosinophils Relative: 1 %
HCT: 34.5 % — ABNORMAL LOW (ref 39.0–52.0)
Hemoglobin: 11.1 g/dL — ABNORMAL LOW (ref 13.0–17.0)
Immature Granulocytes: 1 %
Lymphocytes Relative: 23 %
Lymphs Abs: 1.4 10*3/uL (ref 0.7–4.0)
MCH: 25.1 pg — ABNORMAL LOW (ref 26.0–34.0)
MCHC: 32.2 g/dL (ref 30.0–36.0)
MCV: 77.9 fL — ABNORMAL LOW (ref 80.0–100.0)
Monocytes Absolute: 0.7 10*3/uL (ref 0.1–1.0)
Monocytes Relative: 12 %
Neutro Abs: 3.9 10*3/uL (ref 1.7–7.7)
Neutrophils Relative %: 63 %
Platelets: 217 10*3/uL (ref 150–400)
RBC: 4.43 MIL/uL (ref 4.22–5.81)
RDW: 18.3 % — ABNORMAL HIGH (ref 11.5–15.5)
WBC: 6.2 10*3/uL (ref 4.0–10.5)
nRBC: 0 % (ref 0.0–0.2)

## 2018-12-15 LAB — SEDIMENTATION RATE: Sed Rate: 30 mm/hr — ABNORMAL HIGH (ref 0–20)

## 2018-12-15 LAB — PROTIME-INR
INR: 4.7 (ref 0.8–1.2)
Prothrombin Time: 43.3 seconds — ABNORMAL HIGH (ref 11.4–15.2)

## 2018-12-15 LAB — C-REACTIVE PROTEIN: CRP: 7.5 mg/dL — ABNORMAL HIGH (ref <1.0)

## 2018-12-15 MED ORDER — HYDROCODONE-ACETAMINOPHEN 5-325 MG PO TABS
2.0000 | ORAL_TABLET | Freq: Once | ORAL | Status: AC
  Administered 2018-12-15: 2 via ORAL
  Filled 2018-12-15: qty 2

## 2018-12-15 MED ORDER — HYDROCODONE-ACETAMINOPHEN 5-325 MG PO TABS: 2.0000 | ORAL_TABLET | Freq: Four times a day (QID) | ORAL | 0 refills | Status: DC | PRN

## 2018-12-15 MED ORDER — DOCUSATE SODIUM 100 MG PO CAPS: ORAL_CAPSULE | ORAL | 0 refills | Status: DC

## 2018-12-15 NOTE — Discharge Instructions (Addendum)
As we discussed, we believe that you have some bleeding in your left knee as a result of being on warfarin.  Try to use the knee immobilizer as much as you can to keep the knee still, elevate when possible, and be sure to use cold therapy, either ice packs or the polar device you received when you had your surgery.  Call the orthopedics clinic in the morning to schedule a follow-up appointment with Dr. Marry Guan or Rachelle Hora, and be sure and let them know that Dr. Marry Guan said you should have a follow-up appointment for your emergency department visit at the next available opportunity.  They may also reach out to you with a phone call.  It is also very important that you reach out to Dr. Ouida Sills.  Let him know that your INR tonight in the emergency department was 4.7 even though you did not take your warfarin on Monday.  I recommend that you do not take your warfarin on Tuesday or Wednesday, but please discuss this with Dr. Ouida Sills and ask him to order outpatient INR testing for you so that you can be tested this week to find out where your INR is after missing a few days.  Given your history of blood clots in the legs and the lungs, it might be dangerous to reverse your warfarin, but it is important that you not take any of your medication for a couple of days.  Take Norco as prescribed for severe pain. Do not drink alcohol, drive or participate in any other potentially dangerous activities while taking this medication as it may make you sleepy. Do not take this medication with any other sedating medications, either prescription or over-the-counter. If you were prescribed Percocet or Vicodin, do not take these with acetaminophen (Tylenol) as it is already contained within these medications.   This medication is an opiate (or narcotic) pain medication and can be habit forming.  Use it as little as possible to achieve adequate pain control.  Do not use or use it with extreme caution if you have a history of  opiate abuse or dependence.  If you are on a pain contract with your primary care doctor or a pain specialist, be sure to let them know you were prescribed this medication today from the [facility] Emergency Department.  This medication is intended for your use only - do not give any to anyone else and keep it in a secure place where nobody else, especially children, have access to it.  It will also cause or worsen constipation, so you may want to consider taking an over-the-counter stool softener while you are taking this medication.

## 2018-12-15 NOTE — ED Notes (Signed)
MD at bedside explaining discharge instructions as well as follow up care. Pt provides verbal understanding.

## 2018-12-15 NOTE — ED Provider Notes (Signed)
[facility] Emergency Department Provider Note  ____________________________________________   First MD Initiated Contact with Patient 12/15/18 0132     (approximate)  I have reviewed the triage vital signs and the nursing notes.   HISTORY  Chief Complaint Knee Pain    HPI Clifford Farley is a 62 y.o. male with medical history as listed below which notably includes a total left knee arthroplasty about 10 months ago.  He presents tonight for acute worsening of pain in the left knee as well as some swelling.  He has been struggling for a couple of weeks with some pain mostly on the lateral aspect of the knee and he has had follow-up appointments with Clifford Farley at [facility] (Dr. Ernest Farley did the original surgery).  He had reassuring lab work performed about a week ago and completed a course of steroids.  He actually had a phone call with Clifford Farley this morning and told Clifford Farley that he was at "about 85%" at that time.  However after the phone call, he was in his car and he tried to get out of the car and found severe pain in the left knee and that he could not bear weight.  It is gotten steadily worse over the course of the day and by tonight he has severe pain with flexion and extension as well as any weightbearing of the knee.  Nothing in particular makes his symptoms better.  He has swelling all throughout the knee, not just on the lateral side.  It does not feel particularly warm and he denies fever/chills, sore throat, chest pain, shortness of breath, nausea, vomiting, abdominal pain, and dysuria.  He has no known contact with COVID-19 patients.        Past Medical History:  Diagnosis Date  . Blood clot in vein 4 years ago   Location of clot unspecified in [facility] paper chart.    . Gout   . Hypertension     Patient Active Problem List   Diagnosis Date Noted  . 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 Hypoxia 12/05/2017  . 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 Primary osteoarthritis of left knee 11/16/2017  . Malaise 12/24/2016  . Chronic gouty arthropathy without tophi 12/10/2016  . Tenosynovitis of right hand 12/10/2016  . Hyperglycemia 11/27/2016  . Protime monitoring 03/08/2015  . Pulmonary embolism (HCC) 12/07/2014  . Hypertension 09/30/2012    Past Surgical History:  Procedure Laterality Date  . FRACTURE SURGERY     wrist  . KNEE ARTHROPLASTY Left 02/18/2018   Procedure: COMPUTER ASSISTED TOTAL KNEE ARTHROPLASTY;  Surgeon: Clifford Heinz, MD;  Location: [facility] ORS;  Service: Orthopedics;  Laterality: Left;  . KNEE ARTHROSCOPY Left 08/23/2016   Procedure: ARTHROSCOPY KNEE WITH PARTIAL MEDIAL MENISECTOMY;  Surgeon: Clifford Heinz, MD;  Location: [facility] ORS;  Service: Orthopedics;  Laterality: Left;    Prior to Admission medications   Medication Sig Start Date End Date Taking? Authorizing Provider  amLODipine (NORVASC) 5 MG tablet Take 1 tablet (5 mg total) by mouth daily. Patient taking differently: Take 5 mg by mouth daily. In the evening 12/02/17 12/02/18  Jeanmarie Plant, MD  docusate sodium (COLACE) 100 MG capsule Take 1 tablet once or twice daily as needed for constipation while taking narcotic pain medicine 12/15/18   Clifford Rose, MD  hydrochlorothiazide (HYDRODIURIL) 25 MG tablet Take 1 tablet (25 mg total) by mouth daily. 12/02/17   Clifford Filbert, MD  HYDROcodone-acetaminophen (NORCO/VICODIN) 5-325 MG tablet Take 2 tablets by mouth every 6 (six) hours as needed for moderate pain or severe pain. 12/15/18   Clifford Farley, Clifford Grainger, MD  oxyCODONE (OXY IR/ROXICODONE) 5 MG immediate release tablet Take 1 tablet (5 mg total) by mouth every 4 (four) hours as needed for moderate pain (pain score 4-6). 02/19/18   Tera Farley, Clifford R, PA  traMADol (ULTRAM) 50 MG tablet Take 1-2 tablets (50-100 mg total) by mouth every 4 (four) hours as needed for moderate pain. 02/19/18   Tera Farley, Clifford R, PA  warfarin (COUMADIN) 3 MG tablet Take 3 mg  by mouth daily. In the evening    [provider]    Allergies Patient has no known allergies.  History reviewed. No pertinent family history.  Social History Social History   Tobacco Use  . Smoking status: Never Smoker  . Smokeless tobacco: Never Used  Substance Use Topics  . Alcohol use: No  . Drug use: No    Review of Systems Constitutional: No fever/chills Eyes: No visual changes. ENT: No sore throat. Cardiovascular: Denies chest pain. Respiratory: Denies shortness of breath. Gastrointestinal: No abdominal pain.  No nausea, no vomiting.  No diarrhea.  No constipation. Genitourinary: Negative for dysuria. Musculoskeletal: Acute onset swelling and pain in the left knee. Integumentary: Negative for rash. Neurological: Negative for headaches, focal weakness or numbness.   ____________________________________________   PHYSICAL EXAM:  VITAL SIGNS: ED Triage Vitals [12/15/18 0059]  Enc Vitals Group     BP 95/72     Pulse Rate 88     Resp 17     Temp 98.6 F (37 C)     Temp Source Oral     SpO2 96 %     Weight      Height      Head Circumference      Peak Flow      Pain Score      Pain Loc      Pain Edu?      Excl. in GC?     Constitutional: Alert and oriented.  Appears uncomfortable when he tries to move around. Eyes: Conjunctivae are normal.  Head: Atraumatic. Nose: No congestion/rhinnorhea. Mouth/Throat: Mucous membranes are moist. Neck: No stridor.  No meningeal signs.   Cardiovascular: Normal rate, regular rhythm. Good peripheral circulation. Grossly normal heart sounds. Respiratory: Normal respiratory effort.  No retractions. Gastrointestinal: Soft and nontender. No distention.  Musculoskeletal: The patient has a large effusion that includes most of the left knee that is tender to palpation with pain with flexion extension of the knee and really any amount of range of motion.  However it is not erythematous and not warm and does not appear  consistent with cellulitis or a septic knee. Neurologic:  Normal speech and language. No gross focal neurologic deficits are appreciated.  Skin:  Skin is warm, dry and intact. Psychiatric: Mood and affect are normal. Speech and behavior are normal.  ____________________________________________   LABS (all labs ordered are listed, but only abnormal results are displayed)  Labs Reviewed  CBC WITH DIFFERENTIAL/PLATELET - Abnormal; Notable for the following components:      Result Value   Hemoglobin 11.1 (*)    HCT 34.5 (*)    MCV 77.9 (*)    MCH 25.1 (*)    RDW 18.3 (*)    All other components within normal limits  BASIC METABOLIC PANEL - Abnormal; Notable for the following components:   Glucose, Bld 145 (*)  Creatinine, Ser 1.42 (*)    Calcium 8.6 (*)    GFR calc non Af Amer 52 (*)    All other components within normal limits  SEDIMENTATION RATE - Abnormal; Notable for the following components:   Sed Rate 30 (*)    All other components within normal limits  PROTIME-INR - Abnormal; Notable for the following components:   Prothrombin Time 43.3 (*)    INR 4.7 (*)    All other components within normal limits  C-REACTIVE PROTEIN   ____________________________________________  EKG  No indication for EKG ____________________________________________  RADIOLOGY I, Hinda Kehr, personally viewed and evaluated these images (plain radiographs) as part of my medical decision making, as well as reviewing the written report by the radiologist.  ED MD interpretation:  Possible hemarthrosis  Official radiology report(s): Dg Knee Complete 4 Views Left  Result Date: 12/15/2018 CLINICAL DATA:  63 year old male with increasing left knee pain. No known injury. Left knee arthroplasty in November 2019. EXAM: LEFT KNEE - COMPLETE 4+ VIEW COMPARISON:  Postoperative radiographs 02/18/2018. FINDINGS: Large and hyperdense appearing knee joint effusion on the cross-table lateral view.  Superimposed total arthroplasty hardware appears intact and normally aligned. No acute osseous abnormality identified. Calcified peripheral vascular disease. IMPRESSION: 1. Large and hyperdense appearing knee joint effusion raising the possibility of hemarthrosis. 2. Hardware intact.  No acute osseous abnormality identified. Electronically Signed   By: Genevie Ann M.D.   On: 12/15/2018 02:16    ____________________________________________   PROCEDURES   Procedure(s) performed (including Critical Care):  Procedures   ____________________________________________   INITIAL IMPRESSION / MDM / [place] / ED COURSE  As part of my medical decision making, I reviewed the following data within the [place] notes reviewed and incorporated, Labs reviewed , Old chart reviewed, Discussed with orthopedic surgery (Dr. Marry Guan, Notes from prior ED visits and [place] Controlled Substance Database   Differential diagnosis includes, but is not limited to, muscle strain/sprain, septic joint, gout, inflammatory arthritis with effusion, hemarthrosis.  Labs and radiographs pending.  The physical exam does not suggest infection although it is certainly possible.  I anticipate discussing the case with Dr. Marry Guan once some of the basic labs and imaging is back.  No indication for emergent arthrocentesis given the presence of hardware and the concern for introducing infection into the joint; I will discuss the need for this procedure with Dr. Marry Guan by phone.      Clinical Course as of Dec 14 528  Tue Dec 15, 2018  0215 WBC: 6.2 [CF]  3419 Radiographs demonstrate large joint effusion that is likely consistent with hemarthrosis.  I verified with the patient that he is taking warfarin.  I suspect this is a hemarthrosis which is much more consistent with the presentation than a septic joint.  DG Knee Complete 4 Views Left [CF]  0240 Paged Dr. Marry Guan to discuss case by phone   [CF]   [phone] I discussed the case by phone with Dr. Marry Guan.  We discussed that in detail and he reviewed the record as well.  He agrees that this is most likely hemarthrosis based on the information I provided.  We are awaiting the results of the pro time INR to make sure he is not supratherapeutic.  Dr. Marry Guan recommended continuing on the warfarin given the patient's history of DVT and PE and recommended cold therapy, elevation, and knee immobilizer.  They will work on seeing the patient within a couple of days to follow-up  in clinic.  I updated the patient and his wife about the plan and they understand and agree.   [CF]  0517 Supratherapeutic INR.  Given the patient's history of PE and DVT, however, I do not think the patient should be reversed and this is not a life-threatening injury.  His swelling/hemarthrosis is stable and has not gotten worse over the 4 and half hours he has been in the ED.  We will continue with the current plan and I told him in written and verbal instructions to not take his warfarin (which he only takes once a day) either today or tomorrow and that it is very important he follow-up with his PCP to schedule outpatient labs to recheck his INR as soon as possible.  He understands and his wife also acknowledges the importance of outpatient follow-up.  I gave my usual and customary return precautions.  INR(!!): 4.7 [CF]    Clinical Course User Index [CF] Clifford Farley, Casie Sturgeon, MD     ____________________________________________  FINAL CLINICAL IMPRESSION(S) / ED DIAGNOSES  Final diagnoses:  Hemarthrosis of left knee  Supratherapeutic INR     MEDICATIONS GIVEN DURING THIS VISIT:  Medications  HYDROcodone-acetaminophen (NORCO/VICODIN) 5-325 MG per tablet 2 tablet (2 tablets Oral Given 12/15/18 0347)     ED Discharge Orders         Ordered    HYDROcodone-acetaminophen (NORCO/VICODIN) 5-325 MG tablet  Every 6 hours PRN     12/15/18 0328    docusate sodium (COLACE) 100 MG capsule      12/15/18 0328          *Please note:  Fionn Sweeting was evaluated in Emergency Department on 12/15/2018 for the symptoms described in the history of present illness. He was evaluated in the context of the global COVID-19 pandemic, which necessitated consideration that the patient might be at risk for infection with the SARS-CoV-2 virus that causes COVID-19. Institutional protocols and algorithms that pertain to the evaluation of patients at risk for COVID-19 are in a state of rapid change based on information released by regulatory bodies including the CDC and federal and state organizations. These policies and algorithms were followed during the patient's care in the ED.  Some ED evaluations and interventions may be delayed as a result of limited staffing during the pandemic.*  Note:  This document was prepared using Dragon voice recognition software and may include unintentional dictation errors.   Clifford Farley, Aeralyn Barna, MD 12/15/18 0530

## 2018-12-15 NOTE — ED Triage Notes (Signed)
Pt arrived via EMS from home where pt went to go drive to grocery store and started to have increased pain in the left knee. Pt returned into home and pain worsened throughout the night/AM. Pt had knee replacement surgery to same knee in November. Area is swollen on assessment. Pt denies injury.

## 2018-12-29 ENCOUNTER — Encounter: Payer: Self-pay | Admitting: Intensive Care

## 2018-12-29 ENCOUNTER — Other Ambulatory Visit: Payer: Self-pay

## 2018-12-29 ENCOUNTER — Emergency Department: Payer: Medicare Other

## 2018-12-29 ENCOUNTER — Emergency Department
Admission: EM | Admit: 2018-12-29 | Discharge: 2018-12-29 | Disposition: A | Discharge status: Home / Self Care | Payer: Medicare Other | Attending: Student in an Organized Health Care Education/Training Program | Admitting: Student in an Organized Health Care Education/Training Program

## 2018-12-29 DIAGNOSIS — Z7901 Long term (current) use of anticoagulants: Secondary | ICD-10-CM | POA: Insufficient documentation

## 2018-12-29 DIAGNOSIS — R310 Gross hematuria: Secondary | ICD-10-CM | POA: Diagnosis present

## 2018-12-29 DIAGNOSIS — I10 Essential (primary) hypertension: Secondary | ICD-10-CM | POA: Diagnosis not present

## 2018-12-29 DIAGNOSIS — Z96652 Presence of left artificial knee joint: Secondary | ICD-10-CM | POA: Diagnosis not present

## 2018-12-29 DIAGNOSIS — Z79899 Other long term (current) drug therapy: Secondary | ICD-10-CM | POA: Diagnosis not present

## 2018-12-29 LAB — PROTIME-INR
INR: 3.4 — ABNORMAL HIGH (ref 0.8–1.2)
Prothrombin Time: 34 seconds — ABNORMAL HIGH (ref 11.4–15.2)

## 2018-12-29 LAB — CBC WITH DIFFERENTIAL/PLATELET
Abs Immature Granulocytes: 0.01 10*3/uL (ref 0.00–0.07)
Basophils Absolute: 0 10*3/uL (ref 0.0–0.1)
Basophils Relative: 0 %
Eosinophils Absolute: 0 10*3/uL (ref 0.0–0.5)
Eosinophils Relative: 1 %
HCT: 40.8 % (ref 39.0–52.0)
Hemoglobin: 13 g/dL (ref 13.0–17.0)
Immature Granulocytes: 0 %
Lymphocytes Relative: 36 %
Lymphs Abs: 1.5 10*3/uL (ref 0.7–4.0)
MCH: 24.5 pg — ABNORMAL LOW (ref 26.0–34.0)
MCHC: 31.9 g/dL (ref 30.0–36.0)
MCV: 77 fL — ABNORMAL LOW (ref 80.0–100.0)
Monocytes Absolute: 0.5 10*3/uL (ref 0.1–1.0)
Monocytes Relative: 13 %
Neutro Abs: 2 10*3/uL (ref 1.7–7.7)
Neutrophils Relative %: 50 %
Platelets: 273 10*3/uL (ref 150–400)
RBC: 5.3 MIL/uL (ref 4.22–5.81)
RDW: 18.9 % — ABNORMAL HIGH (ref 11.5–15.5)
WBC: 4.1 10*3/uL (ref 4.0–10.5)
nRBC: 0 % (ref 0.0–0.2)

## 2018-12-29 LAB — COMPREHENSIVE METABOLIC PANEL
ALT: 21 U/L (ref 0–44)
AST: 14 U/L — ABNORMAL LOW (ref 15–41)
Albumin: 3.9 g/dL (ref 3.5–5.0)
Alkaline Phosphatase: 65 U/L (ref 38–126)
Anion gap: 9 (ref 5–15)
BUN: 19 mg/dL (ref 8–23)
CO2: 24 mmol/L (ref 22–32)
Calcium: 9.2 mg/dL (ref 8.9–10.3)
Chloride: 102 mmol/L (ref 98–111)
Creatinine, Ser: 1.33 mg/dL — ABNORMAL HIGH (ref 0.61–1.24)
GFR calc Af Amer: >60 mL/min (ref >60)
GFR calc non Af Amer: 56 mL/min — ABNORMAL LOW (ref >60)
Glucose, Bld: 111 mg/dL — ABNORMAL HIGH (ref 70–99)
Potassium: 3.6 mmol/L (ref 3.5–5.1)
Sodium: 135 mmol/L (ref 135–145)
Total Bilirubin: 0.7 mg/dL (ref 0.3–1.2)
Total Protein: 7.2 g/dL (ref 6.5–8.1)

## 2018-12-29 LAB — URINALYSIS, COMPLETE (UACMP) WITH MICROSCOPIC
Bacteria, UA: NONE SEEN
Bilirubin Urine: NEGATIVE
Glucose, UA: NEGATIVE mg/dL
Ketones, ur: NEGATIVE mg/dL
Leukocytes,Ua: NEGATIVE
Nitrite: NEGATIVE
Protein, ur: NEGATIVE mg/dL
RBC / HPF: >50 RBC/hpf — ABNORMAL HIGH (ref 0–5)
Specific Gravity, Urine: 1.013 (ref 1.005–1.030)
Squamous Epithelial / HPF: NONE SEEN (ref 0–5)
WBC, UA: NONE SEEN WBC/hpf (ref 0–5)
pH: 7 (ref 5.0–8.0)

## 2018-12-29 MED ORDER — PHYTONADIONE 5 MG PO TABS
2.5000 mg | ORAL_TABLET | Freq: Once | ORAL | Status: AC
  Administered 2018-12-29: 21:00:00 2.5 mg via ORAL
  Filled 2018-12-29: qty 1

## 2018-12-29 NOTE — ED Notes (Signed)
Called Pharmacy and they will send up medication for pt 

## 2018-12-29 NOTE — ED Triage Notes (Signed)
Patient c/o hematuria for 2 days. Denies pain or any other urinary symptoms. Patient takes warfarin daily.

## 2018-12-29 NOTE — ED Provider Notes (Signed)
[facility] Emergency Department Provider Note    First MD Initiated Contact with Patient 12/29/18 [phone]     (approximate)  I have reviewed the triage vital signs and the nursing notes.   HISTORY  Chief Complaint Hematuria    HPI Clifford Farley is a 63 y.o. male on Coumadin for history of DVT presents the ER for evaluation of gross hematuria for the past 2 to 3 days.  States is initially orange-colored and is becoming darker.  He is not passing any clots.  Denies any weakness fatigue chest pain or shortness of breath.  Has had issues with supratherapeutic Coumadin in the past.  Denies any pain at this time.  Not having difficulty emptying his bladder.    Past Medical History:  Diagnosis Date  . Blood clot in vein 4 years ago   Location of clot unspecified in [facility] paper chart.    . Gout   . Hypertension    History reviewed. No pertinent family history. Past Surgical History:  Procedure Laterality Date  . FRACTURE SURGERY     wrist  . KNEE ARTHROPLASTY Left 02/18/2018   Procedure: COMPUTER ASSISTED TOTAL KNEE ARTHROPLASTY;  Surgeon: Donato Heinz, MD;  Location: [facility] ORS;  Service: Orthopedics;  Laterality: Left;  . KNEE ARTHROSCOPY Left 08/23/2016   Procedure: ARTHROSCOPY KNEE WITH PARTIAL MEDIAL MENISECTOMY;  Surgeon: Donato Heinz, MD;  Location: [facility] ORS;  Service: Orthopedics;  Laterality: Left;   Patient Active Problem List   Diagnosis Date Noted  . 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 Hypoxia 12/05/2017  . History of DVT (deep vein thrombosis) 12/05/2017  . Primary osteoarthritis of left knee 11/16/2017  . Malaise 12/24/2016  . Chronic gouty arthropathy without tophi 12/10/2016  . Tenosynovitis of right hand 12/10/2016  . Hyperglycemia 11/27/2016  . Protime monitoring 03/08/2015  . Pulmonary embolism (HCC) 12/07/2014  . Hypertension 09/30/2012      Prior to Admission medications   Medication Sig Start Date End Date Taking?  Authorizing Provider  amLODipine (NORVASC) 5 MG tablet Take 1 tablet (5 mg total) by mouth daily. Patient taking differently: Take 5 mg by mouth daily. In the evening 12/02/17 12/02/18  Jeanmarie Plant, MD  docusate sodium (COLACE) 100 MG capsule Take 1 tablet once or twice daily as needed for constipation while taking narcotic pain medicine 12/15/18   Loleta Rose, MD  hydrochlorothiazide (HYDRODIURIL) 25 MG tablet Take 1 tablet (25 mg total) by mouth daily. 12/02/17   Emily Filbert, MD  HYDROcodone-acetaminophen (NORCO/VICODIN) 5-325 MG tablet Take 2 tablets by mouth every 6 (six) hours as needed for moderate pain or severe pain. 12/15/18   Loleta Rose, MD  oxyCODONE (OXY IR/ROXICODONE) 5 MG immediate release tablet Take 1 tablet (5 mg total) by mouth every 4 (four) hours as needed for moderate pain (pain score 4-6). 02/19/18   Tera Partridge, PA  traMADol (ULTRAM) 50 MG tablet Take 1-2 tablets (50-100 mg total) by mouth every 4 (four) hours as needed for moderate pain. 02/19/18   Tera Partridge, PA  warfarin (COUMADIN) 3 MG tablet Take 3 mg by mouth daily. In the evening    [provider]    Allergies Patient has no known allergies.    Social History Social History   Tobacco Use  . Smoking status: Never Smoker  . Smokeless tobacco: Never Used  Substance Use Topics  . Alcohol use: No  . Drug use: No    Review of  Systems Patient denies headaches, rhinorrhea, blurry vision, numbness, shortness of breath, chest pain, edema, cough, abdominal pain, nausea, vomiting, diarrhea, dysuria, fevers, rashes or hallucinations unless otherwise stated above in HPI. ____________________________________________   PHYSICAL EXAM:  VITAL SIGNS: Vitals:   12/29/18 1757 12/29/18 2118  BP: 125/88 125/88  Pulse: 95 95  Resp: 16 17  Temp: 99 F (37.2 C) 98.5 F (36.9 C)  SpO2: 95% 99%    Constitutional: Alert and oriented.  Eyes: Conjunctivae are normal.  Head: Atraumatic. Nose: No  congestion/rhinnorhea. Mouth/Throat: Mucous membranes are moist.   Neck: No stridor. Painless ROM.  Cardiovascular: Normal rate, regular rhythm. Grossly normal heart sounds.  Good peripheral circulation. Respiratory: Normal respiratory effort.  No retractions. Lungs CTAB. Gastrointestinal: Soft and nontender. No distention. No abdominal bruits. No CVA tenderness. Genitourinary:  Musculoskeletal: No lower extremity tenderness nor edema.  No joint effusions. Neurologic:  Normal speech and language. No gross focal neurologic deficits are appreciated. No facial droop Skin:  Skin is warm, dry and intact. No rash noted. Psychiatric: Mood and affect are normal. Speech and behavior are normal.  ____________________________________________   LABS (all labs ordered are listed, but only abnormal results are displayed)  Results for orders placed or performed during the hospital encounter of 12/29/18 (from the past 24 hour(s))  CBC with Differential     Status: Abnormal   Collection Time: 12/29/18  6:04 PM  Result Value Ref Range   WBC 4.1 4.0 - 10.5 K/uL   RBC 5.30 4.22 - 5.81 MIL/uL   Hemoglobin 13.0 13.0 - 17.0 g/dL   HCT 16.1 09.6 - 04.5 %   MCV 77.0 (L) 80.0 - 100.0 fL   MCH 24.5 (L) 26.0 - 34.0 pg   MCHC 31.9 30.0 - 36.0 g/dL   RDW 40.9 (H) 81.1 - 91.4 %   Platelets 273 150 - 400 K/uL   nRBC 0.0 0.0 - 0.2 %   Neutrophils Relative % 50 %   Neutro Abs 2.0 1.7 - 7.7 K/uL   Lymphocytes Relative 36 %   Lymphs Abs 1.5 0.7 - 4.0 K/uL   Monocytes Relative 13 %   Monocytes Absolute 0.5 0.1 - 1.0 K/uL   Eosinophils Relative 1 %   Eosinophils Absolute 0.0 0.0 - 0.5 K/uL   Basophils Relative 0 %   Basophils Absolute 0.0 0.0 - 0.1 K/uL   Immature Granulocytes 0 %   Abs Immature Granulocytes 0.01 0.00 - 0.07 K/uL  Comprehensive metabolic panel     Status: Abnormal   Collection Time: 12/29/18  6:04 PM  Result Value Ref Range   Sodium 135 135 - 145 mmol/L   Potassium 3.6 3.5 - 5.1 mmol/L    Chloride 102 98 - 111 mmol/L   CO2 24 22 - 32 mmol/L   Glucose, Bld 111 (H) 70 - 99 mg/dL   BUN 19 8 - 23 mg/dL   Creatinine, Ser 7.82 (H) 0.61 - 1.24 mg/dL   Calcium 9.2 8.9 - 95.6 mg/dL   Total Protein 7.2 6.5 - 8.1 g/dL   Albumin 3.9 3.5 - 5.0 g/dL   AST 14 (L) 15 - 41 U/L   ALT 21 0 - 44 U/L   Alkaline Phosphatase 65 38 - 126 U/L   Total Bilirubin 0.7 0.3 - 1.2 mg/dL   GFR calc non Af Amer 56 (L) >60 mL/min   GFR calc Af Amer >60 >60 mL/min   Anion gap 9 5 - 15  Protime-INR     Status:  Abnormal   Collection Time: 12/29/18  6:04 PM  Result Value Ref Range   Prothrombin Time 34.0 (H) 11.4 - 15.2 seconds   INR 3.4 (H) 0.8 - 1.2  Urinalysis, Complete w Microscopic     Status: Abnormal   Collection Time: 12/29/18  6:05 PM  Result Value Ref Range   Color, Urine YELLOW (A) YELLOW   APPearance HAZY (A) CLEAR   Specific Gravity, Urine 1.013 1.005 - 1.030   pH 7.0 5.0 - 8.0   Glucose, UA NEGATIVE NEGATIVE mg/dL   Hgb urine dipstick LARGE (A) NEGATIVE   Bilirubin Urine NEGATIVE NEGATIVE   Ketones, ur NEGATIVE NEGATIVE mg/dL   Protein, ur NEGATIVE NEGATIVE mg/dL   Nitrite NEGATIVE NEGATIVE   Leukocytes,Ua NEGATIVE NEGATIVE   RBC / HPF >50 (H) 0 - 5 RBC/hpf   WBC, UA NONE SEEN 0 - 5 WBC/hpf   Bacteria, UA NONE SEEN NONE SEEN   Squamous Epithelial / LPF NONE SEEN 0 - 5   Mucus PRESENT    ____________________________________________ ____________________________________________  RADIOLOGY  I personally reviewed all radiographic images ordered to evaluate for the above acute complaints and reviewed radiology reports and findings.  These findings were personally discussed with the patient.  Please see medical record for radiology report.  ____________________________________________   PROCEDURES  Procedure(s) performed:  Procedures    Critical Care performed: no ____________________________________________   INITIAL IMPRESSION / ASSESSMENT AND PLAN / ED COURSE   Pertinent labs & imaging results that were available during my care of the patient were reviewed by me and considered in my medical decision making (see chart for details).   DDX: Mass, supratherapeutic INR, stone, AAA  Dareld Lyon is a 63 y.o. who presents to the ED with symptoms as described above.  Patient nontoxic-appearing afebrile and hemodynamically stable.  Hemoglobin stable at 3.  After 2 to 3 days of hematuria with stable hemoglobin not having any signs of significant acute blood loss anemia.  Presentation is complicated by him being on Coumadin and having some minor bleeding.  Does not meet criteria or indication for urgent reversal.  Will hold Coumadin.  Will get CT imaging to evaluate for any evidence of mass and evaluate for the by differential.  Also make sure there is no sign of cystitis.  Clinical Course as of Dec 29 2127  Tue Dec 29, 2018  2026 Discussed case in consultation with Dr. Bernardo Heater of urology.  Agrees to help arrange for close outpatient follow-up.  Patient remains hemodynamically stable.  As he is having some gross hematuria will add on vitamin K to help with reversal but his INR is not significantly elevated his hemodynamics are stable and his hemoglobin is also stable.   [PR]    Clinical Course User Index [PR] Merlyn Lot, MD   Patient has been observed in the ER.  Not have any worsening hematuria.  Able to empty his bladder.  There is no evidence of acute urinary retention with stable hemodynamics and close outpatient follow-up with PCP and urology I think that a trial of outpatient follow-up is appropriate.  Patient is comfortable with this plan.  Demonstrates understanding of strict return parameters.  The patient was evaluated in Emergency Department today for the symptoms described in the history of present illness. He/she was evaluated in the context of the global COVID-19 pandemic, which necessitated consideration that the patient might be at risk for  infection with the SARS-CoV-2 virus that causes COVID-19. Institutional protocols and algorithms that pertain  to the evaluation of patients at risk for COVID-19 are in a state of rapid change based on information released by regulatory bodies including the CDC and federal and state organizations. These policies and algorithms were followed during the patient's care in the ED.  As part of my medical decision making, I reviewed the following data within the electronic MEDICAL RECORD NUMBER Nursing notes reviewed and incorporated, Labs reviewed, notes from prior ED visits and [place] Controlled Substance Database   ____________________________________________   FINAL CLINICAL IMPRESSION(S) / ED DIAGNOSES  Final diagnoses:  Gross hematuria      NEW MEDICATIONS STARTED DURING THIS VISIT:  Discharge Medication List as of 12/29/2018  9:15 PM       Note:  This document was prepared using Dragon voice recognition software and may include unintentional dictation errors.    Willy Eddyobinson, Fabiana Dromgoole, MD 12/29/18 2129

## 2019-01-19 ENCOUNTER — Ambulatory Visit (INDEPENDENT_AMBULATORY_CARE_PROVIDER_SITE_OTHER): Payer: Medicare Other | Admitting: Urology

## 2019-01-19 ENCOUNTER — Encounter: Payer: Self-pay | Admitting: Urology

## 2019-01-19 ENCOUNTER — Other Ambulatory Visit: Payer: Self-pay

## 2019-01-19 VITALS — BP 116/89 | HR 99 | Ht 75.0 in | Wt 242.0 lb

## 2019-01-19 DIAGNOSIS — R310 Gross hematuria: Secondary | ICD-10-CM | POA: Diagnosis not present

## 2019-01-19 DIAGNOSIS — N5203 Combined arterial insufficiency and corporo-venous occlusive erectile dysfunction: Secondary | ICD-10-CM

## 2019-01-19 DIAGNOSIS — Z125 Encounter for screening for malignant neoplasm of prostate: Secondary | ICD-10-CM

## 2019-01-19 MED ORDER — SILDENAFIL CITRATE 20 MG PO TABS: 20.0000 mg | ORAL_TABLET | ORAL | 11 refills | Status: AC | PRN

## 2019-01-19 NOTE — Progress Notes (Signed)
01/19/2019 2:15 PM   Clifford Farley Sep 15, 1955 [id number]  Referring provider: Kirk Ruths, MD [facility] [place],  [place] [address]  Chief Complaint  Patient presents with  . Hematuria    New Patient    HPI: 63 year old male with a episode of painless gross hematuria who presents today for further evaluation.  He was seen and evaluated in the emergency room a few weeks ago with acute onset gross hematuria.  At the time, he was noted to be supratherapeutic with an INR of 4.7 for which he received vitamin K.  Urinalysis at the time showed greater than 50 red blood cells per high-powered field, otherwise was unremarkable.  He denies ever seeing blood prior to this or after.  He has no significant urinary symptoms including no urgency frequency, dysuria, history of UTIs.  No flank pain or history of kidney stones.  Never smoker.    No recent PSA screening.  As part of his evaluation, he underwent a noncontrast CT scan which showed no kidney stones or significant GU pathology.  There is an incidental 10 mm left external iliac lymph node which was nonspecific and did not meet pathologic criteria.  He mentions today that he has a personal history of erectile dysfunction.  He has difficulty maintaining achieving erections for satisfactory penetration.  He would like to try prescription of sildenafil.  PMH: Past Medical History:  Diagnosis Date  . Blood clot in vein 4 years ago   Location of clot unspecified in [facility] paper chart.    . Gout   . Hypertension     Surgical History: Past Surgical History:  Procedure Laterality Date  . FRACTURE SURGERY     wrist  . KNEE ARTHROPLASTY Left 02/18/2018   Procedure: COMPUTER ASSISTED TOTAL KNEE ARTHROPLASTY;  Surgeon: Dereck Leep, MD;  Location: [facility] ORS;  Service: Orthopedics;  Laterality: Left;  . KNEE ARTHROSCOPY Left 08/23/2016   Procedure: ARTHROSCOPY KNEE WITH PARTIAL MEDIAL  MENISECTOMY;  Surgeon: Dereck Leep, MD;  Location: [facility] ORS;  Service: Orthopedics;  Laterality: Left;    Home Medications:  Allergies as of 01/19/2019   No Known Allergies     Medication List       Accurate as of January 19, 2019 11:59 PM. If you have any questions, ask your nurse or doctor.        STOP taking these medications   docusate sodium 100 MG capsule Commonly known as: Colace Stopped by: Hollice Espy, MD   hydrochlorothiazide 25 MG tablet Commonly known as: HYDRODIURIL Stopped by: Hollice Espy, MD   oxyCODONE 5 MG immediate release tablet Commonly known as: Oxy IR/ROXICODONE Stopped by: Hollice Espy, MD   traMADol 50 MG tablet Commonly known as: ULTRAM Stopped by: Hollice Espy, MD     TAKE these medications   amLODipine 5 MG tablet Commonly known as: NORVASC Take 1 tablet (5 mg total) by mouth daily. What changed: additional instructions   HYDROcodone-acetaminophen 5-325 MG tablet Commonly known as: NORCO/VICODIN Take 2 tablets by mouth every 6 (six) hours as needed for moderate pain or severe pain.   sildenafil 20 MG tablet Commonly known as: Revatio Take 1 tablet (20 mg total) by mouth as needed. Take 1-5 tabs as needed prior to intercourse Started by: Hollice Espy, MD   valsartan-hydrochlorothiazide 80-12.5 MG tablet Commonly known as: DIOVAN-HCT Take 1 tablet by mouth daily.   warfarin 3 MG tablet Commonly known as: COUMADIN Take 3 mg  by mouth daily. In the evening       Allergies: No Known Allergies  Family History: Family History  Problem Relation Age of Onset  . Prostate cancer Neg Hx   . Kidney cancer Neg Hx   . Bladder Cancer Neg Hx     Social History:  reports that he has never smoked. He has never used smokeless tobacco. He reports that he does not drink alcohol or use drugs.  ROS: UROLOGY Frequent Urination?: No Hard to postpone urination?: No Burning/pain with urination?: No Get up at night to urinate?:  No Leakage of urine?: No Urine stream starts and stops?: No Trouble starting stream?: No Do you have to strain to urinate?: No Blood in urine?: Yes Urinary tract infection?: No Sexually transmitted disease?: No Injury to kidneys or bladder?: No Painful intercourse?: No Weak stream?: No Erection problems?: No Penile pain?: No  Gastrointestinal Nausea?: No Vomiting?: No Indigestion/heartburn?: No Diarrhea?: No Constipation?: No  Constitutional Fever: No Night sweats?: No Weight loss?: No Fatigue?: No  Skin Skin rash/lesions?: No Itching?: No  Eyes Blurred vision?: No Double vision?: No  Ears/Nose/Throat Sore throat?: No Sinus problems?: No  Hematologic/Lymphatic Swollen glands?: No Easy bruising?: No  Cardiovascular Leg swelling?: No Chest pain?: No  Respiratory Cough?: No Shortness of breath?: No  Endocrine Excessive thirst?: No  Musculoskeletal Back pain?: No Joint pain?: No  Neurological Headaches?: No Dizziness?: No  Psychologic Depression?: No Anxiety?: No  Physical Exam: BP 116/89   Pulse 99   Ht 6\' 3"  (1.905 m)   Wt 242 lb (109.8 kg)   BMI 30.25 kg/m   Constitutional:  Alert and oriented, No acute distress. HEENT: [place] AT, moist mucus membranes.  Trachea midline, no masses. Cardiovascular: No clubbing, cyanosis, or edema. Respiratory: Normal respiratory effort, no increased work of breathing. GI: Abdomen is soft, nontender, nondistended, no abdominal masses GU: No CVA tenderness Rectal: Normal sphincter tone.  Normal prostate, nontender no nodules.. Skin: No rashes, bruises or suspicious lesions. Neurologic: Grossly intact, no focal deficits, moving all 4 extremities. Psychiatric: Normal mood and affect.  Laboratory Data: Lab Results  Component Value Date   WBC 4.1 12/29/2018   HGB 13.0 12/29/2018   HCT 40.8 12/29/2018   MCV 77.0 (L) 12/29/2018   PLT 273 12/29/2018    Lab Results  Component Value Date   CREATININE 1.33  (H) 12/29/2018    Lab Results  Component Value Date   HGBA1C 6.3 11/30/2014    Urinalysis Results for orders placed or performed in visit on 01/19/19  Microscopic Examination   URINE  Result Value Ref Range   WBC, UA 0-5 0 - 5 /hpf   RBC None seen 0 - 2 /hpf   Epithelial Cells (non renal) 0-10 0 - 10 /hpf   Bacteria, UA None seen None seen/Few  Urinalysis, Complete  Result Value Ref Range   Specific Gravity, UA 1.020 1.005 - 1.030   pH, UA 7.0 5.0 - 7.5   Color, UA Yellow Yellow   Appearance Ur Clear Clear   Leukocytes,UA Negative Negative   Protein,UA Negative Negative/Trace   Glucose, UA Negative Negative   Ketones, UA Negative Negative   RBC, UA Negative Negative   Bilirubin, UA Negative Negative   Urobilinogen, Ur 1.0 0.2 - 1.0 mg/dL   Nitrite, UA Negative Negative   Microscopic Examination See below:   PSA  Result Value Ref Range   Prostate Specific Ag, Serum 0.7 0.0 - 4.0 ng/mL     Pertinent  Imaging: Results for orders placed during the hospital encounter of 12/29/18  CT Renal Stone Study   Narrative CLINICAL DATA:  Hematuria for 2 days.  Flank pain.  EXAM: CT ABDOMEN AND PELVIS WITHOUT CONTRAST  TECHNIQUE: Multidetector CT imaging of the abdomen and pelvis was performed following the standard protocol without IV contrast.  COMPARISON:  CT 12/11/2011  FINDINGS: Lower chest: Mild elevation of left hemidiaphragm. Heart is normal in size. No acute airspace disease.  Hepatobiliary: Hepatic steatosis without focal hepatic abnormality. Gallbladder physiologically distended, no calcified stone. No biliary dilatation.  Pancreas: No ductal dilatation or inflammation.  Spleen: Normal in size without focal abnormality. Splenule a superiorly.  Adrenals/Urinary Tract: Normal adrenal glands. No renal stones or hydronephrosis. No significant perinephric edema. No evidence of focal renal mass on noncontrast exam. Both ureters are decompressed. No ureteral  calculi. Urinary bladder is minimally distended. No bladder stone or wall thickening. No perivesicular edema.  Stomach/Bowel: Stomach is within normal limits. Appendix appears normal. No evidence of bowel wall thickening, distention, or inflammatory changes.  Vascular/Lymphatic: Mild aortic atherosclerosis. No aneurysm. There is a prominent 10 mm left external iliac node series 2, image 89, not seen on prior exam.  Reproductive: Prostate is unremarkable.  Other: Tiny fat containing umbilical hernia. No free air, free fluid, or intra-abdominal fluid collection.  Musculoskeletal: There are no acute or suspicious osseous abnormalities. Mild degenerative change in the spine and hips.  IMPRESSION: 1. No renal stones or obstructive uropathy. No explanation for flank pain or hematuria. 2. Hepatic steatosis. 3. Prominent 10 mm left external iliac lymph node is nonspecific, may be reactive however recommend in a male patient of this age, consider checking PSA.  Aortic Atherosclerosis (ICD10-I70.0).   Electronically Signed   By: Narda RutherfordMelanie  Sanford M.D.   On: 12/29/2018 19:45    CT scan was personally reviewed, agree with radiology interpretation.  Assessment & Plan:    1. Gross hematuria Painless gross hematuria in the setting of supra therapeutic INR  Unfortunately, noncontrast CT scan was obtained thus upper tract imaging is fairly limited.  We discussed the differential diagnosis and need for hematuria evaluation.  We will start with a cystoscopy and then evaluate whether he needs further upper tract imaging depending procedural findings.  He is agreeable this plan. - Urinalysis, Complete  2. Screening PSA (prostate specific antigen) PSA screening updated today, rectal exam unremarkable and PSA pending - PSA  3. Combined arterial insufficiency and corporo-venous occlusive erectile dysfunction Personal history of erectile dysfunction requesting prescription for Viagra No  contraindications  Return in about 2 weeks (around 02/02/2019) for cysto.  Vanna ScotlandAshley Amana Bouska, MD  [facility] [address], [address] [place], [place] [address] [phone]

## 2019-01-20 LAB — URINALYSIS, COMPLETE
Bilirubin, UA: NEGATIVE
Glucose, UA: NEGATIVE
Ketones, UA: NEGATIVE
Leukocytes,UA: NEGATIVE
Nitrite, UA: NEGATIVE
Protein,UA: NEGATIVE
RBC, UA: NEGATIVE
Specific Gravity, UA: 1.02 (ref 1.005–1.030)
Urobilinogen, Ur: 1 mg/dL (ref 0.2–1.0)
pH, UA: 7 (ref 5.0–7.5)

## 2019-01-20 LAB — PSA: Prostate Specific Ag, Serum: 0.7 ng/mL (ref 0.0–4.0)

## 2019-01-20 LAB — MICROSCOPIC EXAMINATION
Bacteria, UA: NONE SEEN
RBC, Urine: NONE SEEN /HPF (ref 0–2)

## 2019-02-09 ENCOUNTER — Encounter: Payer: Self-pay | Admitting: Urology

## 2019-02-09 ENCOUNTER — Other Ambulatory Visit: Payer: Self-pay

## 2019-02-09 ENCOUNTER — Ambulatory Visit (INDEPENDENT_AMBULATORY_CARE_PROVIDER_SITE_OTHER): Payer: Medicare Other | Admitting: Urology

## 2019-02-09 VITALS — BP 124/101 | HR 103 | Ht 75.0 in | Wt 242.0 lb

## 2019-02-09 DIAGNOSIS — R310 Gross hematuria: Secondary | ICD-10-CM

## 2019-02-09 NOTE — Addendum Note (Signed)
Addended by: Tommy Rainwater on: 02/09/2019 04:21 PM   Modules accepted: Orders

## 2019-02-09 NOTE — Progress Notes (Signed)
   02/09/19  CC:  Chief Complaint  Patient presents with  . Cysto    HPI: 63 year old male with a personal history of gross hematuria on Coumadin who presents today for cystoscopic evaluation.  Blood pressure (!) 124/101, pulse (!) 103, height 6\' 3"  (1.905 m), weight 242 lb (109.8 kg). NED. A&Ox3.   No respiratory distress   Abd soft, NT, ND Normal phallus with bilateral descended testicles  Cystoscopy Procedure Note  Patient identification was confirmed, informed consent was obtained, and patient was prepped using Betadine solution.  Lidocaine jelly was administered per urethral meatus.     Pre-Procedure: - Inspection reveals a normal caliber ureteral meatus.  Procedure: The flexible cystoscope was introduced without difficulty - No urethral strictures/lesions are present. - Enlarged prostate trilobar coaptation with bleeding with manipulation - Elevated bladder neck, minimally - Bilateral ureteral orifices identified - Bladder mucosa  reveals no ulcers, tumors, or lesions - No bladder stones - Mild trabeculation  Retroflexion shows minimal intravesical protrusion   Post-Procedure: - Patient tolerated the procedure well  Assessment/ Plan:  1. Gross hematuria Cystoscopy today fairly unremarkable other than for incidental prostamegaly for which he is relatively asymptomatic  Some bleeding with prostatic manipulation, likely underlying cause of gross hematuria in the setting of supra supratherapeutic INR  We discussed that given that his upper tract imaging was without contrast, there is a chance of missed diagnosis including a subtle renal mass and or upper tract pathology.  He is a never smoker and has no other risk factors for upper tract urothelial cancer.  We discussed options including CT urogram versus renal ultrasound.  He will prefer renal ultrasound given the relatively low risk of upper tract urothelial cancer and lack of risk factors.  He understands the  risk of possible missed diagnosis.  Plan for renal ultrasound, will call with results. - Urinalysis, Complete - US Renal; Future   Hollice Espy, MD

## 2019-02-17 ENCOUNTER — Ambulatory Visit: Payer: Medicare Other

## 2019-03-08 ENCOUNTER — Other Ambulatory Visit: Payer: Self-pay

## 2019-03-08 ENCOUNTER — Ambulatory Visit
Admission: RE | Admit: 2019-03-08 | Discharge: 2019-03-08 | Disposition: A | Discharge status: Home / Self Care | Payer: Medicare Other | Source: Ambulatory Visit | Attending: Urology | Admitting: Urology

## 2019-03-08 DIAGNOSIS — R310 Gross hematuria: Secondary | ICD-10-CM | POA: Diagnosis not present

## 2019-09-15 IMAGING — CT CT HEAD W/O CM
3 series · 15 of 47 positions shown, 18 images · non-contrast
Comparison: May 23, 2013

CLINICAL DATA: Dizziness for 3 days. History of hypertension and
blood clots.

EXAM:
CT HEAD WITHOUT CONTRAST
TECHNIQUE: Contiguous axial images were obtained from the base of the skull
through the vertex without intravenous contrast.

[Series 2: head wo · axial · 0.47mm/px · z∈[+735,+860]mm · 9 of 31 slices shown, 12 images]
[im 3/31  brain]
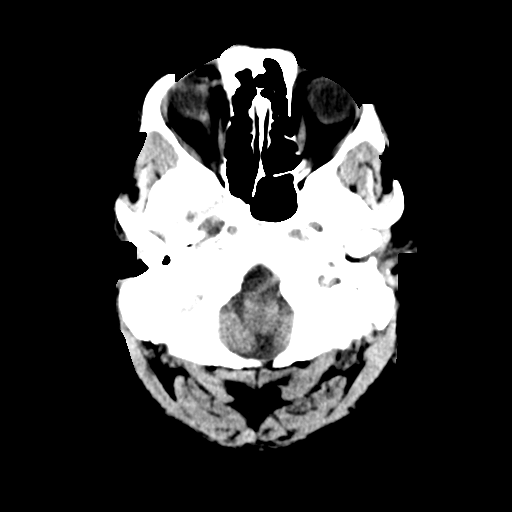
[im 3/31  bone]
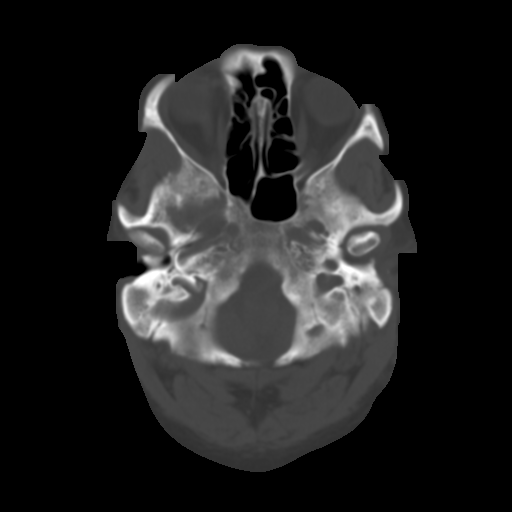
[im 6/31  brain]
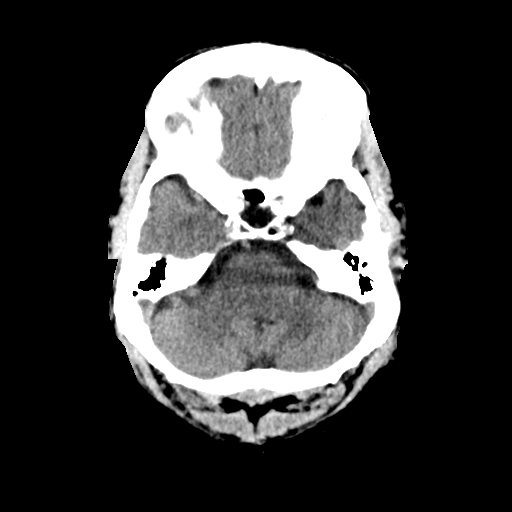
[im 9/31  brain]
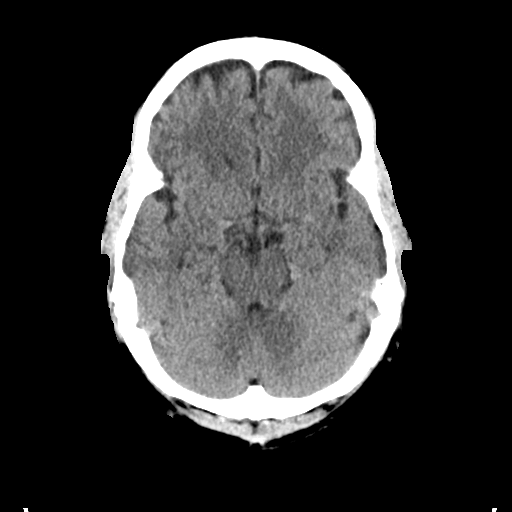
[im 12/31  brain]
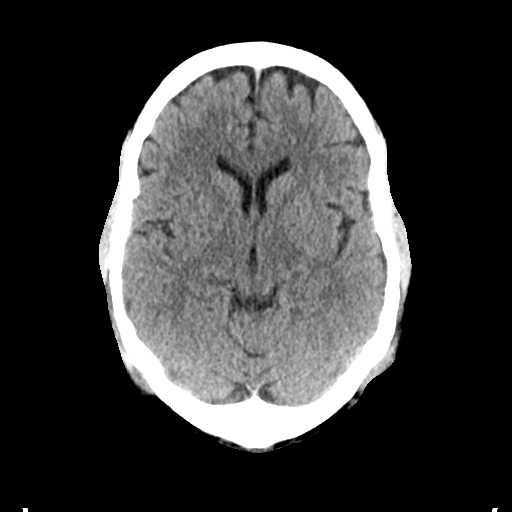
[im 16/31  brain]
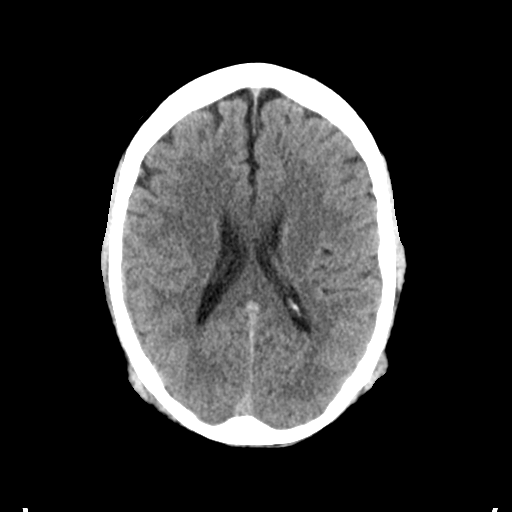
[im 16/31  bone]
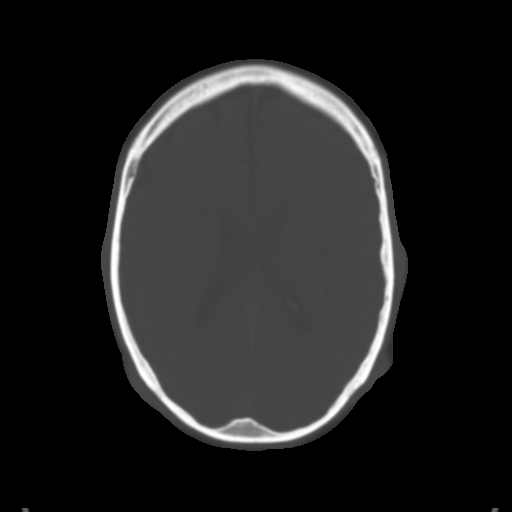
[im 19/31  brain]
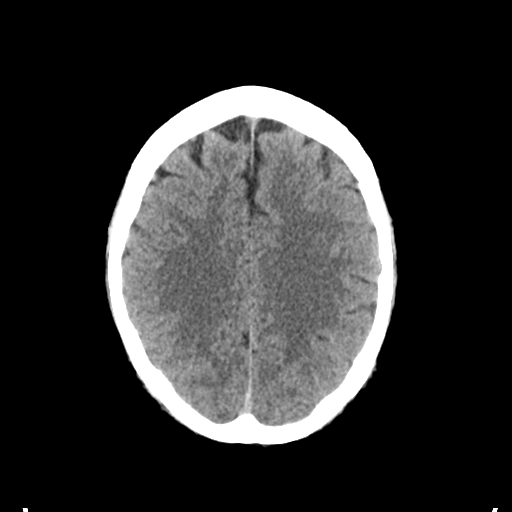
[im 22/31  brain]
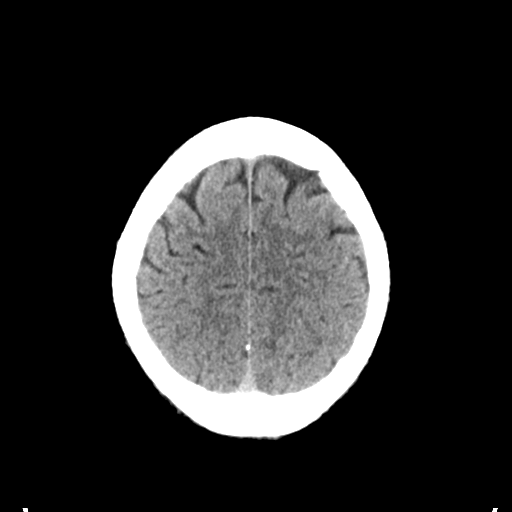
[im 25/31  brain]
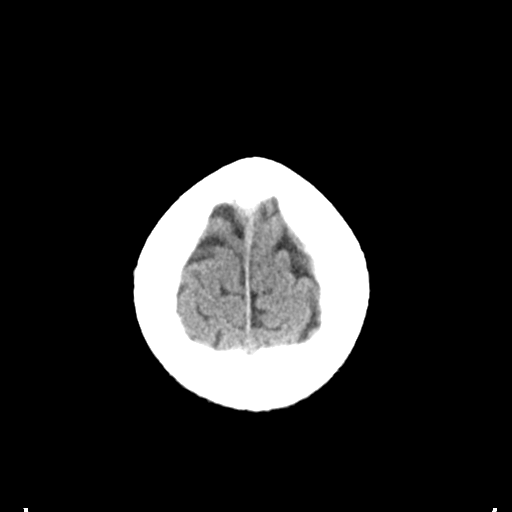
[im 28/31  brain]
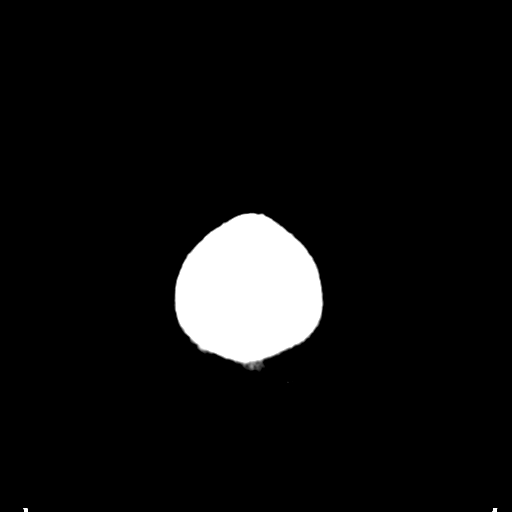
[im 28/31  bone]
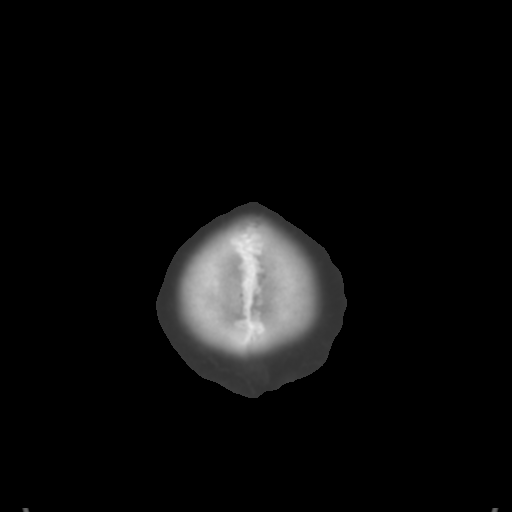

[Series 4: coronal soft tissue · coronal · 0.32mm/px · 3 of 67 slices shown]
[im 23/67  brain]
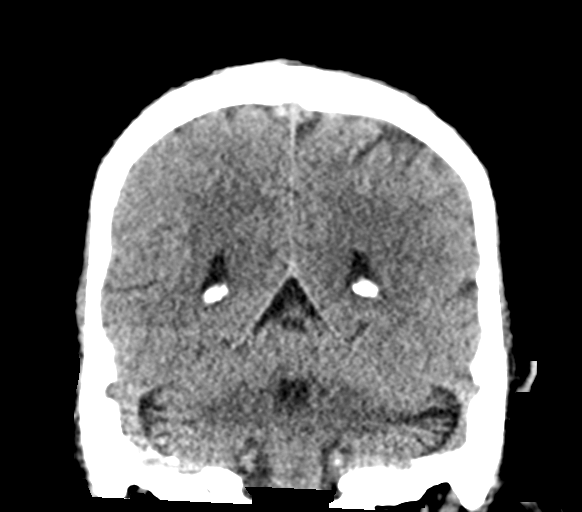
[im 30/67  brain]
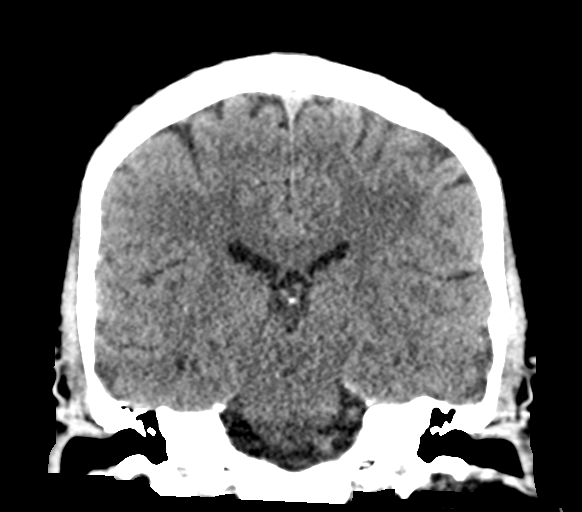
[im 37/67  brain]
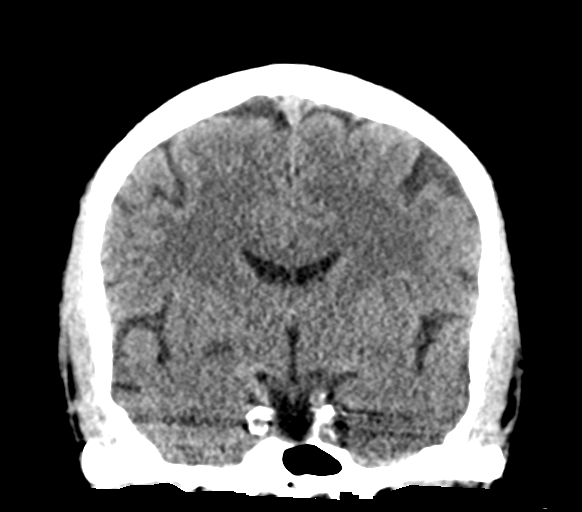

[Series 5: sagittal soft tissue · sagittal · 0.31mm/px · 3 of 52 slices shown]
[im 18/52  brain]
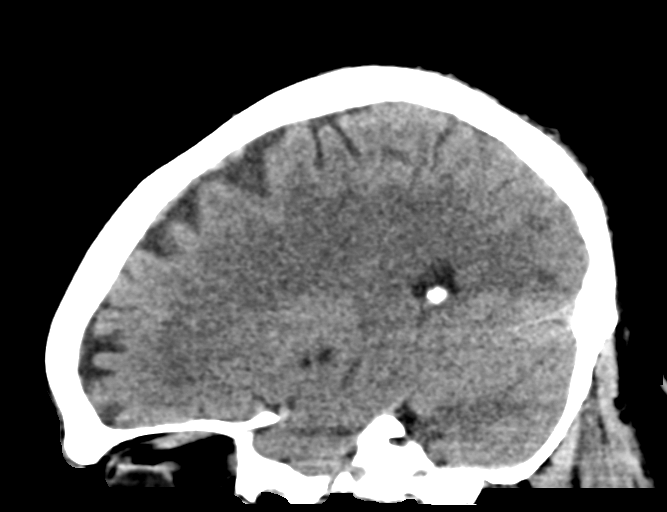
[im 26/52  brain]
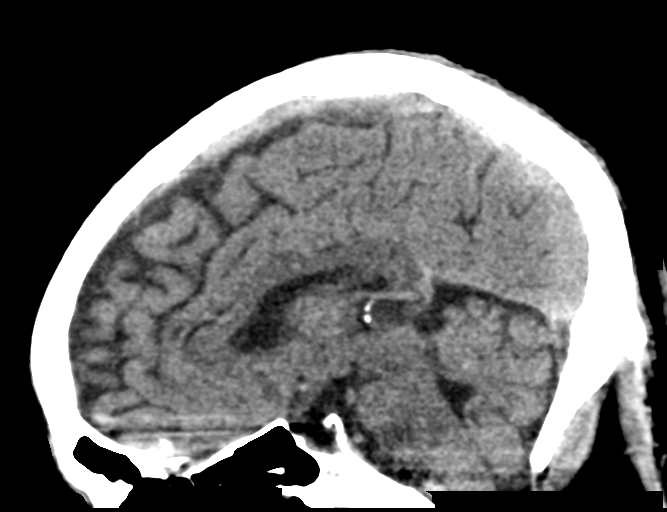
[im 35/52  brain]
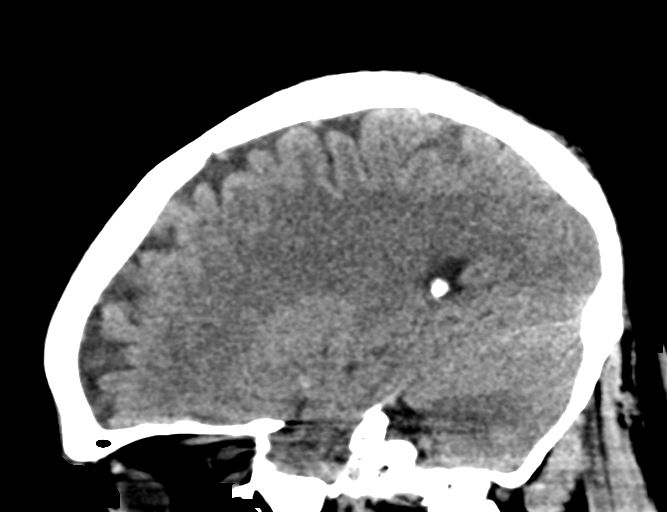

[15 of 47 positions shown; findings below may reference images not displayed]

FINDINGS: Brain: No subdural, epidural, or subarachnoid hemorrhage. A
prominent perivascular space is seen on the right on series 2, image
10, unchanged. The cerebellum, brainstem, and basal cisterns are
normal. Ventricles and sulci are unremarkable. No mass effect or
midline shift. No acute cortical ischemia or infarct identified.

Vascular: Calcified atherosclerosis in the intracranial carotids.

Skull: Normal. Negative for fracture or focal lesion.

Sinuses/Orbits: No acute finding.

Other: None.
IMPRESSION: No acute intracranial abnormalities identified.

## 2019-11-29 ENCOUNTER — Emergency Department
Admission: EM | Admit: 2019-11-29 | Discharge: 2019-11-29 | Disposition: A | Discharge status: Home / Self Care | Payer: Medicare HMO | Attending: Emergency Medicine | Admitting: Emergency Medicine

## 2019-11-29 ENCOUNTER — Emergency Department: Payer: Medicare HMO

## 2019-11-29 ENCOUNTER — Other Ambulatory Visit: Payer: Self-pay

## 2019-11-29 DIAGNOSIS — I10 Essential (primary) hypertension: Secondary | ICD-10-CM | POA: Insufficient documentation

## 2019-11-29 DIAGNOSIS — M19032 Primary osteoarthritis, left wrist: Secondary | ICD-10-CM

## 2019-11-29 DIAGNOSIS — E1165 Type 2 diabetes mellitus with hyperglycemia: Secondary | ICD-10-CM | POA: Diagnosis not present

## 2019-11-29 DIAGNOSIS — M25532 Pain in left wrist: Secondary | ICD-10-CM | POA: Diagnosis present

## 2019-11-29 MED ORDER — LIDOCAINE 5 % EX PTCH
1.0000 | MEDICATED_PATCH | CUTANEOUS | Status: DC
  Administered 2019-11-29: 1 via TRANSDERMAL
  Filled 2019-11-29: qty 1

## 2019-11-29 MED ORDER — LIDOCAINE 5 % EX PTCH: 1.0000 | MEDICATED_PATCH | Freq: Two times a day (BID) | CUTANEOUS | 3 refills | Status: AC

## 2019-11-29 NOTE — Discharge Instructions (Addendum)
Follow-up orthopedic for definitive evaluation and treatment.

## 2019-11-29 NOTE — ED Notes (Signed)
See triage note  Presents with left wrist pain  States pain started about 3 weeks ago  No known injury  Good pulses  Min swelling noted

## 2019-11-29 NOTE — ED Provider Notes (Signed)
[facility] Provider Note   ____________________________________________   First MD Initiated Contact with Patient 11/29/19 1408     (approximate)  I have reviewed the triage vital signs and the nursing notes.   HISTORY  Chief Complaint left wrist pain    HPI Clifford Farley is a 64 y.o. male patient complain of left wrist pain for 3 weeks.  Patient denies provocative incident for complaint.  Patient report wrist surgery 40 years ago.  Patient states intermitting edema to the dorsal aspect of his left wrist.  Patient denies loss of sensation but states decreased range of motion with flexion of the wrist and fingers.  Patient is right-hand dominant.  Rates pain as a 9/10.  No relief with Tylenol.  Cannot take NSAIDs secondary to taking warfarin.         Past Medical History:  Diagnosis Date  . Blood clot in vein 4 years ago   Location of clot unspecified in [facility] paper chart.    . Gout   . Hypertension     Patient Active Problem List   Diagnosis Date Noted  . Drug-induced erectile dysfunction 09/01/2018  . 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 Hypoxia 12/05/2017  . History of DVT (deep vein thrombosis) 12/05/2017  . Primary osteoarthritis of left knee 11/16/2017  . Malaise 12/24/2016  . Chronic gouty arthropathy without tophi 12/10/2016  . Tenosynovitis of right hand 12/10/2016  . Hyperglycemia 11/27/2016  . Controlled type 2 diabetes mellitus with complication, without long-term current use of insulin (HCC) 11/27/2016  . Chronic deep vein thrombosis (DVT) of proximal vein of lower extremity (HCC) 03/14/2016  . Protime monitoring 03/08/2015  . Pulmonary embolism (HCC) 12/07/2014  . Hypertension 09/30/2012    Past Surgical History:  Procedure Laterality Date  . FRACTURE SURGERY     wrist  . KNEE ARTHROPLASTY Left 02/18/2018   Procedure: COMPUTER ASSISTED TOTAL KNEE ARTHROPLASTY;  Surgeon: Donato Heinz, MD;   Location: [facility] ORS;  Service: Orthopedics;  Laterality: Left;  . KNEE ARTHROSCOPY Left 08/23/2016   Procedure: ARTHROSCOPY KNEE WITH PARTIAL MEDIAL MENISECTOMY;  Surgeon: Donato Heinz, MD;  Location: [facility] ORS;  Service: Orthopedics;  Laterality: Left;    Prior to Admission medications   Medication Sig Start Date End Date Taking? Authorizing Provider  amLODipine (NORVASC) 5 MG tablet Take 1 tablet (5 mg total) by mouth daily. Patient taking differently: Take 5 mg by mouth daily. In the evening 12/02/17 12/02/18  Jeanmarie Plant, MD  HYDROcodone-acetaminophen (NORCO/VICODIN) 5-325 MG tablet Take 2 tablets by mouth every 6 (six) hours as needed for moderate pain or severe pain. 12/15/18   Loleta Rose, MD  lidocaine (LIDODERM) 5 % Place 1 patch onto the skin every 12 (twelve) hours. Remove & Discard patch within 12 hours or as directed by MD 11/29/19 11/28/20  Joni Reining, PA-C  sildenafil (REVATIO) 20 MG tablet Take 1 tablet (20 mg total) by mouth as needed. Take 1-5 tabs as needed prior to intercourse 01/19/19   Vanna Scotland, MD  valsartan-hydrochlorothiazide (DIOVAN-HCT) 80-12.5 MG tablet Take 1 tablet by mouth daily. 12/18/18   [provider]  warfarin (COUMADIN) 3 MG tablet Take 3 mg by mouth daily. In the evening    [provider]    Allergies Patient has no known allergies.  Family History  Problem Relation Age of Onset  . Prostate cancer Neg Hx   . Kidney cancer Neg Hx   . Bladder Cancer Neg  Hx     Social History Social History   Tobacco Use  . Smoking status: Never Smoker  . Smokeless tobacco: Never Used  Vaping Use  . Vaping Use: Never used  Substance Use Topics  . Alcohol use: No  . Drug use: No    Review of Systems Constitutional: No fever/chills Eyes: No visual changes. ENT: No sore throat. Cardiovascular: Denies chest pain. Respiratory: Denies shortness of breath. Gastrointestinal: No abdominal pain.  No nausea, no vomiting.  No diarrhea.   No constipation. Genitourinary: Negative for dysuria. Musculoskeletal: Left wrist pain. Skin: Negative for rash. Neurological: Negative for headaches, focal weakness or numbness. Endocrine:  Gout and hypertension.  ____________________________________________   PHYSICAL EXAM:  VITAL SIGNS: ED Triage Vitals  Enc Vitals Group     BP 11/29/19 1311 (!) 129/91     Pulse Rate 11/29/19 1311 90     Resp 11/29/19 1311 18     Temp 11/29/19 1311 98.5 F (36.9 C)     Temp Source 11/29/19 1311 Oral     SpO2 11/29/19 1311 96 %     Weight 11/29/19 1312 245 lb (111.1 kg)     Height 11/29/19 1312 6\' 3"  (1.905 m)     Head Circumference --      Peak Flow --      Pain Score 11/29/19 1343 9     Pain Loc --      Pain Edu? --      Excl. in GC? --    Constitutional: Alert and oriented. Well appearing and in no acute distress. Cardiovascular: Normal rate, regular rhythm. Grossly normal heart sounds.  Good peripheral circulation. Respiratory: Normal respiratory effort.  No retractions. Lungs CTAB. Musculoskeletal: No obvious deformity to the left wrist/hand.  Patient decreased range of motion extension next flexion. Neurologic:  Normal speech and language. No gross focal neurologic deficits are appreciated. No gait instability. Skin:  Skin is warm, dry and intact. No rash noted. Psychiatric: Mood and affect are normal. Speech and behavior are normal.  ____________________________________________   LABS (all labs ordered are listed, but only abnormal results are displayed)  Labs Reviewed - No data to display ____________________________________________  EKG   ____________________________________________  RADIOLOGY  ED MD interpretation:    Official radiology report(s): DG Wrist Complete Left  Result Date: 11/29/2019 CLINICAL DATA:  Pain.  Remote surgery.  No recent fall. EXAM: LEFT WRIST - COMPLETE 3+ VIEW COMPARISON:  05/13/2014 FINDINGS: No acute fracture or dislocation. Scaphoid  intact. Moderate, progressive osteoarthritis involving the radiocarpal and intercarpal articulations. Joint space narrowing and subchondral sclerosis. Subchondral cyst formation in within the radius and scaphoid. IMPRESSION: Progressive osteoarthritis, without acute osseous finding. Electronically Signed   By: 07/12/2014 M.D.   On: 11/29/2019 14:11    ____________________________________________   PROCEDURES  Procedure(s) performed (including Critical Care):  Procedures   ____________________________________________   INITIAL IMPRESSION / ASSESSMENT AND PLAN / ED COURSE  As part of my medical decision making, I reviewed the following data within the electronic MEDICAL RECORD NUMBER     Patient presents with 3 weeks of nonprovocative wrist pain.  Discussed x-ray findings with patient consistent with osteoarthritis.  Patient given discharge care instruction.  Patient requests plan for wrist/hand.  Advised patient splinting with aggravated since complaint of decreased range of motion.  Patient was insistent and was given a volar splint advised to wear during the day.   Patient given a prescription for Lidoderm patch and will follow with orthopedic for definitive  evaluation and treatment.    Clifford Farley was evaluated in Emergency Department on 11/29/2019 for the symptoms described in the history of present illness. He was evaluated in the context of the global COVID-19 pandemic, which necessitated consideration that the patient might be at risk for infection with the SARS-CoV-2 virus that causes COVID-19. Institutional protocols and algorithms that pertain to the evaluation of patients at risk for COVID-19 are in a state of rapid change based on information released by regulatory bodies including the CDC and federal and state organizations. These policies and algorithms were followed during the patient's care in the ED.       ____________________________________________   FINAL CLINICAL  IMPRESSION(S) / ED DIAGNOSES  Final diagnoses:  Arthritis of left wrist     ED Discharge Orders         Ordered    lidocaine (LIDODERM) 5 %  Every 12 hours        11/29/19 1442           Note:  This document was prepared using Dragon voice recognition software and may include unintentional dictation errors.    Joni Reining, PA-C 11/29/19 1450    Delton [place], MD 11/29/19 [phone]

## 2019-11-29 NOTE — ED Triage Notes (Signed)
Pt to ED for left wrist pain that started 3 weeks ago. Denies fall, reports surgery to wrist 40 years ago. Denies taking pain medications for it. Minimal swelling noted to left wrist, limited motion noted as well. No redness.  Pt alert and oriented, clear speech  Denies fevers

## 2020-02-07 ENCOUNTER — Encounter: Payer: Self-pay | Admitting: Urology

## 2020-02-07 ENCOUNTER — Other Ambulatory Visit: Payer: Self-pay | Admitting: Urology

## 2020-02-07 ENCOUNTER — Ambulatory Visit (INDEPENDENT_AMBULATORY_CARE_PROVIDER_SITE_OTHER): Payer: Medicare HMO | Admitting: Urology

## 2020-02-07 ENCOUNTER — Other Ambulatory Visit: Payer: Self-pay

## 2020-02-07 VITALS — BP 146/82 | HR 96 | Ht 75.0 in | Wt 242.1 lb

## 2020-02-07 DIAGNOSIS — R310 Gross hematuria: Secondary | ICD-10-CM | POA: Diagnosis not present

## 2020-02-07 DIAGNOSIS — R319 Hematuria, unspecified: Secondary | ICD-10-CM

## 2020-02-07 NOTE — Progress Notes (Signed)
   02/07/2020 2:43 PM   Clifford Farley 01/29/1956 [id number]  Reason for visit: Hematuria  HPI: I saw Clifford Farley in urology clinic today for evaluation of recurrent hematuria.  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  At that point he was evaluated by Dr. Apolinar Junes who performed a cystoscopy which was benign aside from some friable enlarged prostate tissue.  He had already had a CT without contrast in the ED that was benign, and using shared decision-making they opted to repeat a renal ultrasound instead of a CT for cost and radiation.  Renal ultrasound was benign.  He reports he has been doing well until he recently transitioned over to Xarelto from Coumadin and developed a few days of light pink to light red urine.  This resolved when he stopped the Xarelto.  He also had side effects of rash on the hands as well as joint pain.  He was sent back to urology by his PCP for further evaluation prior to resuming anticoagulation.  When he stopped the Xarelto his urinalysis returned to clear.  He denies any urinary symptoms at this time or dysuria.  He is a never smoker.  We a conversation today about options moving forward including a CT urogram for better evaluation of the collecting system, considering repeat cystoscopy, or urine cytology.  He is adamantly opposed to a CT or repeat cystoscopy secondary to cost and discomfort, which is not unreasonable.  He is amenable to a cytology to look for any abnormal cells, that would potentially warrant further investigation.  We discussed possible risks of missing pathology by deferring CT and cystoscopy at this time, and he understands these risk.  -Cytology sent today, call with results -If cytology suspicious/positive, would recommend follow-up with Dr. Apolinar Junes to discuss further evaluation with CT urogram and consideration of  cystoscopy, possible ureteroscopy, possible biopsy   Sondra Come, MD  [facility] [address] [place], [place] [address] [phone]

## 2020-02-08 ENCOUNTER — Other Ambulatory Visit: Payer: Self-pay

## 2020-02-08 LAB — MICROSCOPIC EXAMINATION: Bacteria, UA: NONE SEEN

## 2020-02-08 LAB — URINALYSIS, COMPLETE
Bilirubin, UA: NEGATIVE
Glucose, UA: NEGATIVE
Ketones, UA: NEGATIVE
Leukocytes,UA: NEGATIVE
Nitrite, UA: NEGATIVE
Protein,UA: NEGATIVE
RBC, UA: NEGATIVE
Specific Gravity, UA: 1.02 (ref 1.005–1.030)
Urobilinogen, Ur: 0.2 mg/dL (ref 0.2–1.0)
pH, UA: 6 (ref 5.0–7.5)

## 2020-02-09 LAB — CYTOLOGY - NON PAP

## 2020-02-10 ENCOUNTER — Telehealth: Payer: Self-pay

## 2020-02-10 NOTE — Telephone Encounter (Signed)
-----   Message from Sondra Come, MD sent at 02/10/2020  8:01 AM EST ----- Good news, no cancer cells on urine sample. Would recommend one year RTC w Dr Apolinar Junes with his long history of hematuria.  Legrand Rams, MD 02/10/2020

## 2020-02-10 NOTE — Telephone Encounter (Signed)
Called pt no answer. Left detailed message for pt per DPR. Appt scheduled.

## 2020-05-05 ENCOUNTER — Other Ambulatory Visit: Payer: Self-pay | Admitting: Family Medicine

## 2020-05-05 DIAGNOSIS — R911 Solitary pulmonary nodule: Secondary | ICD-10-CM

## 2020-05-19 ENCOUNTER — Ambulatory Visit: Admission: RE | Admit: 2020-05-19 | Payer: Medicare HMO | Source: Ambulatory Visit

## 2020-05-25 ENCOUNTER — Other Ambulatory Visit: Payer: Self-pay

## 2020-05-25 ENCOUNTER — Ambulatory Visit
Admission: RE | Admit: 2020-05-25 | Discharge: 2020-05-25 | Disposition: A | Discharge status: Home / Self Care | Payer: Medicare HMO | Source: Ambulatory Visit | Attending: Family Medicine | Admitting: Family Medicine

## 2020-05-25 DIAGNOSIS — R911 Solitary pulmonary nodule: Secondary | ICD-10-CM

## 2020-05-25 MED ORDER — IOHEXOL 300 MG/ML  SOLN
75.0000 mL | Freq: Once | INTRAMUSCULAR | Status: AC | PRN
  Administered 2020-05-25: 75 mL via INTRAVENOUS

## 2020-09-01 ENCOUNTER — Other Ambulatory Visit: Payer: Self-pay | Admitting: Urology

## 2021-02-13 ENCOUNTER — Ambulatory Visit: Payer: Self-pay | Admitting: Urology

## 2021-04-02 NOTE — Progress Notes (Incomplete)
04/02/21 5:49 PM   Clifford Farley October 21, 1955 [id number]  Referring provider:  Kirk Ruths, MD [place] [facility] [place],  [place] [address] No chief complaint on file.    HPI: Clifford Farley is a 66 y.o.male with a personal history of hematuria, who presents today for further evaluation of hematuria.  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 He underwent a cystoscopy with me to further evaluate this. Cystoscopy was benign aside from some friable enlarged prostate tissue.       PMH: Past Medical History:  Diagnosis Date   Blood clot in vein 4 years ago   Location of clot unspecified in [facility] paper chart.     Gout    Hypertension     Surgical History: Past Surgical History:  Procedure Laterality Date   FRACTURE SURGERY     wrist   KNEE ARTHROPLASTY Left 02/18/2018   Procedure: COMPUTER ASSISTED TOTAL KNEE ARTHROPLASTY;  Surgeon: Dereck Leep, MD;  Location: [facility] ORS;  Service: Orthopedics;  Laterality: Left;   KNEE ARTHROSCOPY Left 08/23/2016   Procedure: ARTHROSCOPY KNEE WITH PARTIAL MEDIAL MENISECTOMY;  Surgeon: Dereck Leep, MD;  Location: [facility] ORS;  Service: Orthopedics;  Laterality: Left;    Home Medications:  Allergies as of 04/03/2021   No Known Allergies      Medication List        Accurate as of April 02, 2021  5:49 PM. If you have any questions, ask your nurse or doctor.          colchicine 0.6 MG tablet Take by mouth.   diclofenac Sodium 1 % Gel Commonly known as: VOLTAREN Apply topically.   rosuvastatin 10 MG tablet Commonly known as: CRESTOR Take 10 mg by mouth at bedtime.   sildenafil 20 MG tablet Commonly known as: Revatio Take 1 tablet (20 mg total) by mouth as needed. Take 1-5 tabs as needed prior to intercourse   valsartan-hydrochlorothiazide 80-12.5 MG  tablet Commonly known as: DIOVAN-HCT Take 1 tablet by mouth daily.        Allergies: No Known Allergies  Family History: Family History  Problem Relation Age of Onset   Prostate cancer Neg Hx    Kidney cancer Neg Hx    Bladder Cancer Neg Hx     Social History:  reports that he has never smoked. He has never used smokeless tobacco. He reports that he does not drink alcohol and does not use drugs.   Physical Exam: There were no vitals taken for this visit.  Constitutional:  Alert and oriented, No acute distress. HEENT: [place] AT, moist mucus membranes.  Trachea midline, no masses. Cardiovascular: No clubbing, cyanosis, or edema. Respiratory: Normal respiratory effort, no increased work of breathing. Skin: No rashes, bruises or suspicious lesions. Neurologic: Grossly intact, no focal deficits, moving all 4 extremities. Psychiatric: Normal mood and affect.  Laboratory Data:  Lab Results  Component Value Date   CREATININE 1.33 (H) 12/29/2018    Lab Results  Component Value Date   HGBA1C 6.3 11/30/2014    Urinalysis   Pertinent Imaging:    Assessment & Plan:     No follow-ups on file.  I,Kailey Littlejohn,acting as a Education administrator for Hollice Espy, MD.,have documented all relevant documentation on the behalf of Hollice Espy, MD,as directed by  Hollice Espy, MD while in the presence of [place]  Erlene Quan, MD.   [facility] [address] [place], [place] [address] [phone]

## 2021-04-03 ENCOUNTER — Ambulatory Visit: Payer: Medicare HMO | Admitting: Urology

## 2021-04-06 ENCOUNTER — Encounter: Payer: Self-pay | Admitting: Urology

## 2022-09-09 DIAGNOSIS — E118 Type 2 diabetes mellitus with unspecified complications: Secondary | ICD-10-CM | POA: Diagnosis not present

## 2022-09-09 DIAGNOSIS — I10 Essential (primary) hypertension: Secondary | ICD-10-CM | POA: Diagnosis not present

## 2022-09-09 DIAGNOSIS — Z86718 Personal history of other venous thrombosis and embolism: Secondary | ICD-10-CM | POA: Diagnosis not present

## 2022-11-14 DIAGNOSIS — U071 COVID-19: Secondary | ICD-10-CM | POA: Diagnosis not present

## 2022-11-14 DIAGNOSIS — Z03818 Encounter for observation for suspected exposure to other biological agents ruled out: Secondary | ICD-10-CM | POA: Diagnosis not present

## 2023-02-13 ENCOUNTER — Other Ambulatory Visit: Payer: Self-pay | Admitting: Urology

## 2023-05-07 DIAGNOSIS — Z1331 Encounter for screening for depression: Secondary | ICD-10-CM | POA: Diagnosis not present

## 2023-05-07 DIAGNOSIS — E119 Type 2 diabetes mellitus without complications: Secondary | ICD-10-CM | POA: Diagnosis not present

## 2023-05-07 DIAGNOSIS — I10 Essential (primary) hypertension: Secondary | ICD-10-CM | POA: Diagnosis not present

## 2023-05-07 DIAGNOSIS — Z125 Encounter for screening for malignant neoplasm of prostate: Secondary | ICD-10-CM | POA: Diagnosis not present

## 2023-05-07 DIAGNOSIS — Z0000 Encounter for general adult medical examination without abnormal findings: Secondary | ICD-10-CM | POA: Diagnosis not present

## 2023-07-29 DIAGNOSIS — H2513 Age-related nuclear cataract, bilateral: Secondary | ICD-10-CM | POA: Diagnosis not present

## 2023-07-29 DIAGNOSIS — Z0100 Encounter for examination of eyes and vision without abnormal findings: Secondary | ICD-10-CM | POA: Diagnosis not present

## 2023-08-29 ENCOUNTER — Emergency Department

## 2023-08-29 ENCOUNTER — Emergency Department
Admission: EM | Admit: 2023-08-29 | Discharge: 2023-08-30 | Disposition: A | Discharge status: Home / Self Care | Attending: Emergency Medicine | Admitting: Emergency Medicine

## 2023-08-29 ENCOUNTER — Other Ambulatory Visit: Payer: Self-pay

## 2023-08-29 DIAGNOSIS — K5900 Constipation, unspecified: Secondary | ICD-10-CM | POA: Diagnosis not present

## 2023-08-29 DIAGNOSIS — K5901 Slow transit constipation: Secondary | ICD-10-CM | POA: Diagnosis not present

## 2023-08-29 DIAGNOSIS — K5641 Fecal impaction: Secondary | ICD-10-CM | POA: Insufficient documentation

## 2023-08-29 MED ORDER — DOCUSATE SODIUM 100 MG PO CAPS: 100.0000 mg | ORAL_CAPSULE | Freq: Two times a day (BID) | ORAL | 2 refills | Status: AC

## 2023-08-29 MED ORDER — POLYETHYLENE GLYCOL 3350 17 G PO PACK: 17.0000 g | PACK | Freq: Two times a day (BID) | ORAL | 0 refills | Status: AC

## 2023-08-29 NOTE — ED Provider Notes (Signed)
 [facility] Provider Note    Event Date/Time   First MD Initiated Contact with Patient 08/29/23 2210     (approximate)   History   Constipation   HPI  Clifford Farley is a 68 y.o. male who presents with complaints of constipation.  Patient reports he has not had a bowel movement in 4 days.  He reports is hard for him to urinate when he sits down in particular.  Denies abdominal pain.  No nausea or vomiting.  No new medication     Physical Exam   Triage Vital Signs: ED Triage Vitals  Encounter Vitals Group     BP 08/29/23 2052 (!) 190/106     Systolic BP Percentile --      Diastolic BP Percentile --      Pulse Rate 08/29/23 2052 86     Resp 08/29/23 2052 18     Temp 08/29/23 2052 97.8 F (36.6 C)     Temp Source 08/29/23 2052 Oral     SpO2 08/29/23 2052 95 %     Weight --      Height --      Head Circumference --      Peak Flow --      Pain Score 08/29/23 2051 5     Pain Loc --      Pain Education --      Exclude from Growth Chart --     Most recent vital signs: Vitals:   08/29/23 2052  BP: (!) 190/106  Pulse: 86  Resp: 18  Temp: 97.8 F (36.6 C)  SpO2: 95%     General: Awake, no distress.  CV:  Good peripheral perfusion.  Resp:  Normal effort.  Abd:  No distention.  Other:     ED Results / Procedures / Treatments   Labs (all labs ordered are listed, but only abnormal results are displayed) Labs Reviewed - No data to display   EKG     RADIOLOGY KUB viewed interpret by me, stool ball noted in the rectum    PROCEDURES: yes  ------------------------------------------------------------------------------------------------------------------- Fecal Disimpaction Procedure Note:  Performed by me:  Patient placed in the lateral recumbent position with knees drawn towards chest. Nurse present for patient support. Large amount of hard brown stool removed. No complications during  procedure.   ------------------------------------------------------------------------------------------------------------------   Critical Care performed:   Procedures   MEDICATIONS ORDERED IN ED: Medications - No data to display   IMPRESSION / MDM / ASSESSMENT AND PLAN / ED COURSE  I reviewed the triage vital signs and the nursing notes. Patient's presentation is most consistent with acute complicated illness / injury requiring diagnostic workup.  Patient presents with significant constipation as detailed above, KUB verifies fecal impaction  Disimpaction performed by me with successful removal of hard brown stool.  Will start the patient on MiraLAX  twice daily, Colace        FINAL CLINICAL IMPRESSION(S) / ED DIAGNOSES   Final diagnoses:  Slow transit constipation  Fecal impaction in rectum [facility])     Rx / DC Orders   ED Discharge Orders          Ordered    polyethylene glycol (MIRALAX ) 17 g packet  2 times daily        08/29/23 2244    docusate sodium  (COLACE) 100 MG capsule  2 times daily        08/29/23 2244  Note:  This document was prepared using Dragon voice recognition software and may include unintentional dictation errors.   Bryson Carbine, MD 08/29/23 [phone]

## 2023-08-29 NOTE — ED Triage Notes (Signed)
 Pt reports no bowel movement x 4 days.  Warm prune juice did not help.

## 2024-02-06 NOTE — Progress Notes (Signed)
 Clifford Farley                                          MRN: [id number]   02/06/2024   The [facility] Quality Team Specialist reviewed this patient medical record for the purposes of chart review for care gap closure. The following were reviewed: chart review for care gap closure-colorectal cancer screening.    [facility] Quality Team
